# Patient Record
Sex: Female | Born: 1985 | Race: Asian | Hispanic: No | Marital: Married | Smoking: Never smoker
Health system: Southern US, Community
[De-identification: ages and names within clinical notes are randomized; demographics above are authoritative.]

## PROBLEM LIST (undated history)

## (undated) ENCOUNTER — Inpatient Hospital Stay (HOSPITAL_COMMUNITY): Payer: Self-pay

## (undated) DIAGNOSIS — B1910 Unspecified viral hepatitis B without hepatic coma: Secondary | ICD-10-CM

## (undated) HISTORY — PX: NO PAST SURGERIES: SHX2092

---

## 2012-07-22 ENCOUNTER — Inpatient Hospital Stay (HOSPITAL_COMMUNITY)
Admission: AD | Admit: 2012-07-22 | Discharge: 2012-07-22 | Disposition: A | Discharge status: Home / Self Care | Payer: BC Managed Care – PPO | Source: Ambulatory Visit | Attending: Family Medicine | Admitting: Family Medicine

## 2012-07-22 ENCOUNTER — Inpatient Hospital Stay (HOSPITAL_COMMUNITY): Payer: BC Managed Care – PPO

## 2012-07-22 ENCOUNTER — Encounter (HOSPITAL_COMMUNITY): Payer: Self-pay

## 2012-07-22 DIAGNOSIS — O200 Threatened abortion: Secondary | ICD-10-CM | POA: Insufficient documentation

## 2012-07-22 DIAGNOSIS — O00109 Unspecified tubal pregnancy without intrauterine pregnancy: Secondary | ICD-10-CM | POA: Insufficient documentation

## 2012-07-22 DIAGNOSIS — O209 Hemorrhage in early pregnancy, unspecified: Secondary | ICD-10-CM

## 2012-07-22 HISTORY — DX: Unspecified viral hepatitis B without hepatic coma: B19.10

## 2012-07-22 LAB — CBC WITH DIFFERENTIAL/PLATELET
Basophils Absolute: 0 10*3/uL (ref 0.0–0.1)
Eosinophils Relative: 3 % (ref 0–5)
HCT: 34.5 % — ABNORMAL LOW (ref 36.0–46.0)
Lymphocytes Relative: 16 % (ref 12–46)
Lymphs Abs: 1.6 10*3/uL (ref 0.7–4.0)
MCV: 71.6 fL — ABNORMAL LOW (ref 78.0–100.0)
Monocytes Absolute: 0.6 10*3/uL (ref 0.1–1.0)
Monocytes Relative: 6 % (ref 3–12)
RDW: 13.1 % (ref 11.5–15.5)
WBC: 10 10*3/uL (ref 4.0–10.5)

## 2012-07-22 LAB — URINALYSIS, ROUTINE W REFLEX MICROSCOPIC
Glucose, UA: NEGATIVE mg/dL
Protein, ur: 100 mg/dL — AB
pH: 5.5 (ref 5.0–8.0)

## 2012-07-22 LAB — POCT PREGNANCY, URINE: Preg Test, Ur: POSITIVE — AB

## 2012-07-22 LAB — URINE MICROSCOPIC-ADD ON

## 2012-07-22 MED ORDER — HYDROCODONE-ACETAMINOPHEN 5-325 MG PO TABS: 1.0000 | ORAL_TABLET | ORAL | Status: DC | PRN

## 2012-07-22 MED ORDER — IBUPROFEN 600 MG PO TABS: 600.0000 mg | ORAL_TABLET | Freq: Four times a day (QID) | ORAL | Status: DC | PRN

## 2012-07-22 NOTE — MAU Provider Note (Signed)
Chart reviewed and agree with management and plan.  

## 2012-07-22 NOTE — MAU Note (Signed)
Pt speaks minimal english per aunt. Will use language line for vietnamese interpreter to translate

## 2012-07-22 NOTE — MAU Provider Note (Signed)
History     CSN: [id number]  Arrival date and time: 07/22/12 0932   None     Chief Complaint  Patient presents with  . Vaginal Bleeding   Vaginal Bleeding  History provided by pt, with her friend acting as interpreter. Pt speaks [place]  Pt is a 27 y/o G2P1, LMP 06/14/12 (today is 07/22/12) who presents today with vaginal bleeding x 4 days. Her pregnancy was confirmed in a doctor's office although she doesn't know which one. She reports that since she started bleeding she has gone through 22 pads, 4 being today. Her bleeding has been similar to how she bleeding during a normal period, but today is the heaviest. She has passed either clots or tissue, she's not sure which. She has had intermittent pain across her suprapubic region that began around the same time as the bleeding. It feels like her menstrual cramps feel but today it is at its worst. She has not tried taking anything for the pain. She has felt well otherwise. She has a 2 y/o son who was born in [place] and was a normal pregnancy with no complications.    Past Medical History  Diagnosis Date  . Hepatitis B     Past Surgical History  Procedure Laterality Date  . No past surgeries      History reviewed. No pertinent family history.  History  Substance Use Topics  . Smoking status: Never Smoker   . Smokeless tobacco: Never Used  . Alcohol Use: No    Allergies: No Known Allergies  No prescriptions prior to admission    Review of Systems  Genitourinary: Positive for vaginal bleeding.   A 14-point review of systems was asked and positive pertinent and negatives are discussed in the HPI.  Physical Exam   Blood pressure 115/71, pulse 86, temperature 98.1 F (36.7 C), temperature source Oral, resp. rate 16, height 5\' 2"  (1.575 m), weight 72.576 kg (160 lb), last menstrual period 06/14/2012.  Physical Exam  Constitutional: She appears well-developed and well-nourished. No distress.  HENT:  Head:  Normocephalic.  Neck: Normal range of motion. Neck supple.  Respiratory: Effort normal. No respiratory distress.  Psychiatric: She has a normal mood and affect. Her behavior is normal.   Results for orders placed during the hospital encounter of 07/22/12 (from the past 24 hour(s))  URINALYSIS, ROUTINE W REFLEX MICROSCOPIC     Status: Abnormal   Collection Time    07/22/12  9:48 AM      Result Value Range   Color, Urine RED (*) YELLOW   APPearance CLOUDY (*) CLEAR   Specific Gravity, Urine 1.020  1.005 - 1.030   pH 5.5  5.0 - 8.0   Glucose, UA NEGATIVE  NEGATIVE mg/dL   Hgb urine dipstick LARGE (*) NEGATIVE   Bilirubin Urine NEGATIVE  NEGATIVE   Ketones, ur NEGATIVE  NEGATIVE mg/dL   Protein, ur 409 (*) NEGATIVE mg/dL   Urobilinogen, UA 1.0  0.0 - 1.0 mg/dL   Nitrite POSITIVE (*) NEGATIVE   Leukocytes, UA TRACE (*) NEGATIVE  URINE MICROSCOPIC-ADD ON     Status: Abnormal   Collection Time    07/22/12  9:48 AM      Result Value Range   Squamous Epithelial / LPF FEW (*) RARE   WBC, UA 0-2  <3 WBC/hpf   RBC / HPF TOO NUMEROUS TO COUNT  <3 RBC/hpf   Bacteria, UA FEW (*) RARE  POCT PREGNANCY, URINE     Status: Abnormal  Collection Time    07/22/12  9:50 AM      Result Value Range   Preg Test, Ur POSITIVE (*) NEGATIVE  HCG, QUANTITATIVE, PREGNANCY     Status: Abnormal   Collection Time    07/22/12 10:23 AM      Result Value Range   hCG, Beta Chain, Quant, S 5035 (*) <5 mIU/mL  CBC WITH DIFFERENTIAL     Status: Abnormal   Collection Time    07/22/12 10:25 AM      Result Value Range   WBC 10.0  4.0 - 10.5 K/uL   RBC 4.82  3.87 - 5.11 MIL/uL   Hemoglobin 12.0  12.0 - 15.0 g/dL   HCT 16.1 (*) 09.6 - 04.5 %   MCV 71.6 (*) 78.0 - 100.0 fL   MCH 24.9 (*) 26.0 - 34.0 pg   MCHC 34.8  30.0 - 36.0 g/dL   RDW 40.9  81.1 - 91.4 %   Platelets 195  150 - 400 K/uL   Neutrophils Relative 75  43 - 77 %   Neutro Abs 7.5  1.7 - 7.7 K/uL   Lymphocytes Relative 16  12 - 46 %   Lymphs Abs  1.6  0.7 - 4.0 K/uL   Monocytes Relative 6  3 - 12 %   Monocytes Absolute 0.6  0.1 - 1.0 K/uL   Eosinophils Relative 3  0 - 5 %   Eosinophils Absolute 0.3  0.0 - 0.7 K/uL   Basophils Relative 0  0 - 1 %   Basophils Absolute 0.0  0.0 - 0.1 K/uL    MAU Course  Procedures  Assessment and Plan  DDx: Ectopic pregnancy - can present with vaginal bleeding and abdominal pain but U/S did not see an ectopic. Spontaneous abortion - most likely given clinical presentation and ultrasound findings; can confirm with serum quantitave HCG in 48 hours. Vaginal/cervical lesion/injury - none visualized on physical exam  Abnormal bleeding during 1st trimester, likely spontaneous abortion - urine hcg positive - serum quant consistent with gestational age - Ultrasound - CBC - ABO/Rh - repeat quant HCG in 48 hours - Rx: Ibuprofen 600 mg Q6 hrs prn, no narcotics due to hydrocodone allergy - MDM: Hgb stable and not orthostatic. Ssmall amount of bleeding on return from U/S. Discharge home.   Lorna Dibble, PA-S 07/22/2012, 11:26 AM   MDM 27 y.o. female with bleeding in first trimester pregnancy. Ultrasound without adnexal mass and probably 4.4 weeks IUGS. Patient is not orthostatic and bleeding is light at this time. Detailed instructions on bleeding in early pregnancy given to the patient using a translator. Patient voices understanding. Discussed that this is most likely a failed pregnancy and patient will do expectant management. She will return in 2 days for follow up Bhcg. She will return immediately for any problems.

## 2012-07-22 NOTE — MAU Note (Signed)
Pt/aunt state had +upt at home 2 weeks ago, LMP-06/14/2012. Began bleeding/cramping on Saturday, cramping worse now. Blood noted in urine cup in MAU.

## 2012-07-25 ENCOUNTER — Inpatient Hospital Stay (HOSPITAL_COMMUNITY)
Admission: AD | Admit: 2012-07-25 | Discharge: 2012-07-25 | Disposition: A | Discharge status: Home / Self Care | Payer: BC Managed Care – PPO | Source: Ambulatory Visit | Attending: Obstetrics & Gynecology | Admitting: Obstetrics & Gynecology

## 2012-07-25 DIAGNOSIS — O039 Complete or unspecified spontaneous abortion without complication: Secondary | ICD-10-CM

## 2012-07-25 NOTE — MAU Provider Note (Signed)
  History     CSN: [id number]  Arrival date and time: 07/25/12 0756   None     No chief complaint on file.  HPI  Pt is here for f/u HCG after SAB.  Pt initially seen on 3/25 with vaginal bleeding for 4 days and ultrasound showed IUGS in lower uterine segment.  Her HCG was 5035 and pt is B positive.  Pt continues to have light bleeding and mild cramping.  Past Medical History  Diagnosis Date  . Hepatitis B     Past Surgical History  Procedure Laterality Date  . No past surgeries      No family history on file.  History  Substance Use Topics  . Smoking status: Never Smoker   . Smokeless tobacco: Never Used  . Alcohol Use: No    Allergies: No Known Allergies  Prescriptions prior to admission  Medication Sig Dispense Refill  . HYDROcodone-acetaminophen (NORCO/VICODIN) 5-325 MG per tablet Take 1 tablet by mouth every 4 (four) hours as needed for pain.  10 tablet  0  . ibuprofen (ADVIL,MOTRIN) 600 MG tablet Take 1 tablet (600 mg total) by mouth every 6 (six) hours as needed for pain.  30 tablet  0    ROS Physical Exam   Blood pressure 107/62, pulse 77, temperature 98.4 F (36.9 C), temperature source Oral, resp. rate 16, last menstrual period 06/14/2012.  Physical Exam  Nursing note and vitals reviewed. Constitutional: She is oriented to person, place, and time. She appears well-developed and well-nourished.  HENT:  Head: Normocephalic.  Eyes: Pupils are equal, round, and reactive to light.  Neck: Normal range of motion. Neck supple.  Cardiovascular: Normal rate.   Respiratory: Effort normal.  Musculoskeletal: Normal range of motion.  Neurological: She is alert and oriented to person, place, and time.  Skin: Skin is warm and dry.  Psychiatric: She has a normal mood and affect.    MAU Course  Procedures Results for orders placed during the hospital encounter of 07/25/12 (from the past 24 hour(s))  HCG, QUANTITATIVE, PREGNANCY     Status: Abnormal   Collection Time    07/25/12  8:05 AM      Result Value Range   hCG, Beta Chain, Quant, S 464 (*) <5 mIU/mL  f/u 1 week for repeat HCG  Assessment and Plan  SAB  Megan Benton 07/25/2012, 9:22 AM

## 2012-07-25 NOTE — MAU Note (Signed)
Pt presents for repeat blood work. Denies any pain. States very little vaginal bleeding.

## 2012-07-25 NOTE — MAU Provider Note (Signed)
Attestation of Attending Supervision of Advanced Practitioner (CNM/NP): Evaluation and management procedures were performed by the Advanced Practitioner under my supervision and collaboration.  I have reviewed the Advanced Practitioner's note and chart, and I agree with the management and plan.  Benton, Megan Carvell 1:48 PM     

## 2012-08-01 ENCOUNTER — Telehealth (HOSPITAL_COMMUNITY): Payer: Self-pay | Admitting: *Deleted

## 2012-08-01 ENCOUNTER — Inpatient Hospital Stay (HOSPITAL_COMMUNITY)
Admission: AD | Admit: 2012-08-01 | Discharge: 2012-08-01 | Disposition: A | Discharge status: Home / Self Care | Payer: BC Managed Care – PPO | Source: Ambulatory Visit | Attending: Obstetrics and Gynecology | Admitting: Obstetrics and Gynecology

## 2012-08-01 DIAGNOSIS — O039 Complete or unspecified spontaneous abortion without complication: Secondary | ICD-10-CM | POA: Insufficient documentation

## 2012-08-01 DIAGNOSIS — O034 Incomplete spontaneous abortion without complication: Secondary | ICD-10-CM

## 2012-08-01 LAB — HCG, QUANTITATIVE, PREGNANCY: hCG, Beta Chain, Quant, S: 17 m[IU]/mL — ABNORMAL HIGH (ref <5)

## 2012-08-01 NOTE — MAU Provider Note (Signed)
  History     CSN: [id number]  Arrival date and time: 08/01/12 2214   None     Chief Complaint  Patient presents with  . Follow-up   HPI  Megan Benton is a 27 y.o. G3P1001 who is here for repeat quant for SAB. She is no longer bleeding or having any pain.   Past Medical History  Diagnosis Date  . Hepatitis B     Past Surgical History  Procedure Laterality Date  . No past surgeries      No family history on file.  History  Substance Use Topics  . Smoking status: Never Smoker   . Smokeless tobacco: Never Used  . Alcohol Use: No    Allergies: No Known Allergies  Prescriptions prior to admission  Medication Sig Dispense Refill  . ibuprofen (ADVIL,MOTRIN) 600 MG tablet Take 1 tablet (600 mg total) by mouth every 6 (six) hours as needed for pain.  30 tablet  0    ROS Physical Exam   Blood pressure 93/61, pulse 92, temperature 97.8 F (36.6 C), resp. rate 18, height 5' 1.5" (1.562 m), weight 73.846 kg (162 lb 12.8 oz), last menstrual period 06/14/2012, SpO2 100.00%.  Physical Exam  MAU Course  Procedures  Results for orders placed during the hospital encounter of 08/01/12 (from the past 24 hour(s))  HCG, QUANTITATIVE, PREGNANCY     Status: Abnormal   Collection Time    08/01/12 10:23 PM      Result Value Range   hCG, Beta Chain, Quant, S 17 (*) <5 mIU/mL   Down from 400+  Assessment and Plan  SAB Repeat quant in 2+ weeks  Tawnya Crook 08/01/2012, 11:03 PM

## 2012-08-01 NOTE — MAU Note (Signed)
Megan Benton CNM reviewed lab results with pt and will f/u in 2 wks with BHCG. Pt agrees

## 2012-08-01 NOTE — MAU Note (Signed)
Here for repeat BHCG. Denies any pain, bleeding, or any problems.

## 2012-08-02 NOTE — MAU Provider Note (Signed)
Attestation of Attending Supervision of Advanced Practitioner (CNM/NP): Evaluation and management procedures were performed by the Advanced Practitioner under my supervision and collaboration.  I have reviewed the Advanced Practitioner's note and chart, and I agree with the management and plan.  Ora Mcnatt 08/02/2012 7:47 AM   

## 2013-05-27 ENCOUNTER — Encounter (HOSPITAL_COMMUNITY): Payer: Self-pay | Admitting: *Deleted

## 2013-08-01 ENCOUNTER — Inpatient Hospital Stay (HOSPITAL_COMMUNITY)
Admission: AD | Admit: 2013-08-01 | Discharge: 2013-08-02 | Disposition: A | Discharge status: Home / Self Care | Payer: BC Managed Care – PPO | Source: Ambulatory Visit | Attending: Family Medicine | Admitting: Family Medicine

## 2013-08-01 ENCOUNTER — Encounter (HOSPITAL_COMMUNITY): Payer: Self-pay | Admitting: *Deleted

## 2013-08-01 DIAGNOSIS — O208 Other hemorrhage in early pregnancy: Secondary | ICD-10-CM | POA: Insufficient documentation

## 2013-08-01 DIAGNOSIS — O200 Threatened abortion: Secondary | ICD-10-CM | POA: Insufficient documentation

## 2013-08-01 DIAGNOSIS — O43899 Other placental disorders, unspecified trimester: Secondary | ICD-10-CM

## 2013-08-01 DIAGNOSIS — O418X10 Other specified disorders of amniotic fluid and membranes, first trimester, not applicable or unspecified: Secondary | ICD-10-CM

## 2013-08-01 DIAGNOSIS — O468X1 Other antepartum hemorrhage, first trimester: Secondary | ICD-10-CM

## 2013-08-01 LAB — URINALYSIS, ROUTINE W REFLEX MICROSCOPIC
BILIRUBIN URINE: NEGATIVE
GLUCOSE, UA: NEGATIVE mg/dL
KETONES UR: NEGATIVE mg/dL
Leukocytes, UA: NEGATIVE
Nitrite: NEGATIVE
PROTEIN: NEGATIVE mg/dL
Specific Gravity, Urine: 1.015 (ref 1.005–1.030)
UROBILINOGEN UA: 2 mg/dL — AB (ref 0.0–1.0)
pH: 7.5 (ref 5.0–8.0)

## 2013-08-01 LAB — POCT PREGNANCY, URINE: Preg Test, Ur: POSITIVE — AB

## 2013-08-01 LAB — URINE MICROSCOPIC-ADD ON

## 2013-08-01 NOTE — MAU Note (Signed)
Had positive pregnancy test earlier this wk. Now having some bleeding. No pain. LMP 2/27

## 2013-08-02 ENCOUNTER — Encounter (HOSPITAL_COMMUNITY): Payer: Self-pay | Admitting: Obstetrics and Gynecology

## 2013-08-02 ENCOUNTER — Inpatient Hospital Stay (HOSPITAL_COMMUNITY): Payer: BC Managed Care – PPO

## 2013-08-02 LAB — CBC
HCT: 33.5 % — ABNORMAL LOW (ref 36.0–46.0)
HEMOGLOBIN: 11.8 g/dL — AB (ref 12.0–15.0)
MCH: 25.8 pg — ABNORMAL LOW (ref 26.0–34.0)
MCHC: 35.2 g/dL (ref 30.0–36.0)
MCV: 73.3 fL — ABNORMAL LOW (ref 78.0–100.0)
Platelets: 226 10*3/uL (ref 150–400)
RBC: 4.57 MIL/uL (ref 3.87–5.11)
RDW: 13.3 % (ref 11.5–15.5)
WBC: 10.1 10*3/uL (ref 4.0–10.5)

## 2013-08-02 LAB — WET PREP, GENITAL
Clue Cells Wet Prep HPF POC: NONE SEEN
Trich, Wet Prep: NONE SEEN
Yeast Wet Prep HPF POC: NONE SEEN

## 2013-08-02 LAB — ABO/RH: ABO/RH(D): B POS

## 2013-08-02 LAB — HCG, QUANTITATIVE, PREGNANCY: hCG, Beta Chain, Quant, S: 6087 m[IU]/mL — ABNORMAL HIGH (ref <5)

## 2013-08-02 NOTE — MAU Provider Note (Signed)
Attestation of Attending Supervision of Advanced Practitioner (PA/CNM/NP): Evaluation and management procedures were performed by the Advanced Practitioner under my supervision and collaboration.  I have reviewed the Advanced Practitioner's note and chart, and I agree with the management and plan.  PRATT,TANYA S, MD [facility] Attending 08/02/2013 7:11 AM   

## 2013-08-02 NOTE — MAU Provider Note (Signed)
History     CSN: [id number]  Arrival date and time: 08/01/13 2312   None     Chief Complaint  Patient presents with  . Vaginal Bleeding   HPI  [name] is a 28 y.o. female G3P1011 at [redacted]w[redacted]d who presents with vaginal bleeding. The bleeding started yesterday around 8 pm. The bleeding is described as dark red, small amount. No intercourse recently. Patient denies pain. The patient works in a [employer] and is concerned about working there while pregnant.  Pt is [facility] next week to schedule an appointment.   OB History   Grav Para Term Preterm Abortions TAB SAB Ect Mult Living   3 1 1  1  1   1       Past Medical History  Diagnosis Date  . Hepatitis B     Past Surgical History  Procedure Laterality Date  . No past surgeries      History reviewed. No pertinent family history.  History  Substance Use Topics  . Smoking status: Never Smoker   . Smokeless tobacco: Never Used  . Alcohol Use: No    Allergies: No Known Allergies  Prescriptions prior to admission  Medication Sig Dispense Refill  . ibuprofen (ADVIL,MOTRIN) 600 MG tablet Take 1 tablet (600 mg total) by mouth every 6 (six) hours as needed for pain.  30 tablet  0   Results for orders placed during the hospital encounter of 08/01/13 (from the past 48 hour(s))  URINALYSIS, ROUTINE W REFLEX MICROSCOPIC     Status: Abnormal   Collection Time    08/01/13 11:28 PM      Result Value Ref Range   Color, Urine YELLOW  YELLOW   APPearance CLEAR  CLEAR   Specific Gravity, Urine 1.015  1.005 - 1.030   pH 7.5  5.0 - 8.0   Glucose, UA NEGATIVE  NEGATIVE mg/dL   Hgb urine dipstick LARGE (*) NEGATIVE   Bilirubin Urine NEGATIVE  NEGATIVE   Ketones, ur NEGATIVE  NEGATIVE mg/dL   Protein, ur NEGATIVE  NEGATIVE mg/dL   Urobilinogen, UA 2.0 (*) 0.0 - 1.0 mg/dL   Nitrite NEGATIVE  NEGATIVE   Leukocytes, UA NEGATIVE  NEGATIVE  URINE MICROSCOPIC-ADD ON     Status: Abnormal   Collection  Time    08/01/13 11:28 PM      Result Value Ref Range   Squamous Epithelial / LPF FEW (*) RARE   WBC, UA 0-2  <3 WBC/hpf   RBC / HPF 0-2  <3 RBC/hpf   Bacteria, UA FEW (*) RARE  POCT PREGNANCY, URINE     Status: Abnormal   Collection Time    08/01/13 11:32 PM      Result Value Ref Range   Preg Test, Ur POSITIVE (*) NEGATIVE   Comment:            THE SENSITIVITY OF THIS     METHODOLOGY IS >24 mIU/mL  CBC     Status: Abnormal   Collection Time    08/02/13 12:20 AM      Result Value Ref Range   WBC 10.1  4.0 - 10.5 K/uL   RBC 4.57  3.87 - 5.11 MIL/uL   Hemoglobin 11.8 (*) 12.0 - 15.0 g/dL   HCT 40.9 (*) 81.1 - 91.4 %   MCV 73.3 (*) 78.0 - 100.0 fL   Comment: REPEATED TO VERIFY   MCH 25.8 (*) 26.0 - 34.0 pg   MCHC 35.2  30.0 - 36.0 g/dL   RDW 16.1  09.6 - 04.5 %   Platelets 226  150 - 400 K/uL  HCG, QUANTITATIVE, PREGNANCY     Status: Abnormal   Collection Time    08/02/13 12:20 AM      Result Value Ref Range   hCG, Beta Chain, Quant, S 6087 (*) <5 mIU/mL   Comment:              GEST. AGE      CONC.  (mIU/mL)       <=1 WEEK        5 - 50         2 WEEKS       50 - 500         3 WEEKS       100 - 10,000         4 WEEKS     1,000 - 30,000         5 WEEKS     3,500 - 115,000       6-8 WEEKS     12,000 - 270,000        12 WEEKS     15,000 - 220,000                FEMALE AND NON-PREGNANT FEMALE:         LESS THAN 5 mIU/mL  WET PREP, GENITAL     Status: Abnormal   Collection Time    08/02/13  2:05 AM      Result Value Ref Range   Yeast Wet Prep HPF POC NONE SEEN  NONE SEEN   Trich, Wet Prep NONE SEEN  NONE SEEN   Clue Cells Wet Prep HPF POC NONE SEEN  NONE SEEN   WBC, Wet Prep HPF POC FEW (*) NONE SEEN   Comment: FEW BACTERIA SEEN   US Ob Comp Less 14 Wks  08/02/2013   CLINICAL DATA:  Pain.  Pregnancy.  EXAM: OBSTETRIC <14 WK [place] AND TRANSVAGINAL OB US  TECHNIQUE: Both transabdominal and transvaginal ultrasound examinations were performed for complete evaluation of the  gestation as well as the maternal uterus, adnexal regions, and pelvic cul-de-sac. Transvaginal technique was performed to assess early pregnancy.  COMPARISON:  None.  FINDINGS: Intrauterine gestational sac: Visualized/normal in shape.  Yolk sac:  Present.  Embryo:  Present.  Cardiac Activity: Present.  Heart Rate: 104 bpm  CRL:   0.7 cm 6 w 4 d                  [place] EDC: 03/24/2014  Maternal uterus/adnexae: A 3.6 x 2.2 cm subchorionic hemorrhage is present. Follow-up pelvic ultrasound suggested.  IMPRESSION: 1. Single viable intrauterine pregnancy at 6 weeks 4 days. Fetal heart rate of 104 beats per min noted. 2. 3.6 x 2.2 cm subchorionic hemorrhage. Follow-up pelvic ultrasound suggested.   Electronically Signed   By: Maisie Fus  Register   On: 08/02/2013 02:01   US Ob Transvaginal  08/02/2013   CLINICAL DATA:  Pain.  Pregnancy.  EXAM: OBSTETRIC <14 WK [place] AND TRANSVAGINAL OB US  TECHNIQUE: Both transabdominal and transvaginal ultrasound examinations were performed for complete evaluation of the gestation as well as the maternal uterus, adnexal regions, and pelvic cul-de-sac. Transvaginal technique was performed to assess early pregnancy.  COMPARISON:  None.  FINDINGS: Intrauterine gestational sac: Visualized/normal in shape.  Yolk sac:  Present.  Embryo:  Present.  Cardiac Activity: Present.  Heart Rate: 104 bpm  CRL:   0.7 cm 6 w 4 d                  [place] EDC: 03/24/2014  Maternal uterus/adnexae: A 3.6 x 2.2 cm subchorionic hemorrhage is present. Follow-up pelvic ultrasound suggested.  IMPRESSION: 1. Single viable intrauterine pregnancy at 6 weeks 4 days. Fetal heart rate of 104 beats per min noted. 2. 3.6 x 2.2 cm subchorionic hemorrhage. Follow-up pelvic ultrasound suggested.   Electronically Signed   By: Maisie Fushomas  Register   On: 08/02/2013 02:01     Review of Systems  Constitutional: Negative for fever and chills.  Gastrointestinal: Negative for nausea, vomiting, abdominal pain, diarrhea and constipation.   Genitourinary: Negative for dysuria, urgency, frequency and hematuria.       No vaginal discharge. + vaginal bleeding; scant amount  No dysuria.    Physical Exam   Blood pressure 112/66, pulse 66, temperature 99 F (37.2 C), resp. rate 18, height 5\' 1"  (1.549 m), weight 72.757 kg (160 lb 6.4 oz), last menstrual period 06/26/2013, unknown if currently breastfeeding.  Physical Exam  Constitutional: She is oriented to person, place, and time. She appears well-developed and well-nourished. No distress.  HENT:  Head: Normocephalic.  Eyes: Pupils are equal, round, and reactive to light.  Neck: Neck supple.  GI: Soft. She exhibits no distension. There is no tenderness. There is no rebound and no guarding.  Genitourinary:  Speculum exam: Vagina - Small amount of dark red blood in vaginal canal, no odor Cervix - small active bleeding  Bimanual exam; Cervix FT  Uterus non tender, normal size; enlarged  Adnexa non tender, no masses bilaterally GC/Chlam, wet prep done Chaperone present for exam.   Musculoskeletal: Normal range of motion.  Neurological: She is alert and oriented to person, place, and time.  Skin: Skin is warm. She is not diaphoretic.  Psychiatric: Her behavior is normal.    MAU Course  Procedures  MDM CBC ABO Beta hcg Moderate subchorionic hemorrhage  Preliminary US shows IUP 1732w4d with moderate subchorionic hemorrhage. + cardiac activity with a rate of 104.  B positive blood type  Assessment and Plan   A:  Vaginal bleeding in pregnancy; first trimester  Subchorionic hemorrhage  Threatened miscarriage   P:  Discharge home in stable condition Bleeding precautions discussed [facility] provided; start prenatal care.  Pelvic rest Return to MAU as needed, if symptoms worsen    Iona HansenJennifer Irene Davin Archuletta, NP  08/02/2013, 2:27 AM

## 2013-08-03 LAB — GC/CHLAMYDIA PROBE AMP
CT Probe RNA: NEGATIVE
GC Probe RNA: NEGATIVE

## 2014-03-01 ENCOUNTER — Encounter (HOSPITAL_COMMUNITY): Payer: Self-pay | Admitting: Obstetrics and Gynecology

## 2014-06-06 ENCOUNTER — Encounter (HOSPITAL_COMMUNITY): Payer: Self-pay | Admitting: *Deleted

## 2015-05-01 NOTE — L&D Delivery Note (Signed)
Pt complete and at +1 station with urge to push. Epidural controlling pain. Pt pushed for about 20-30 mins to deliver a viable female infant in ROA position over intact perineum.  Anterior and posterior shoulders spontaneously delivered with next two pushes; body easily followed next. Infant placed on mothers abdomen and bulb suction of mouth and nose performed. Cord was then clamped and cut by FOB. Cord blood obtained. Baby had a vigorous spontaneous cry noted. Placenta remained intrauterine at post delivery. Counselled pt via interpreter about needed to manually remove placenta. Attempted x 1 with no success. Dr Ellyn Hack present and attempted x 2 with successful manual extraction of placenta;  3VC shultz. Fundal massage performed and pitocin per protocol. Fundus firm. First degree lac now noted as well as periurethral lac. Repaired with 4-0 vicryl on an SH and 2-0 vicryl. Pt tolerated well. Mother and baby stable. Counts correct. Apgars 9 and 9

## 2015-05-19 LAB — OB RESULTS CONSOLE RUBELLA ANTIBODY, IGM: Rubella: IMMUNE

## 2015-05-19 LAB — OB RESULTS CONSOLE HIV ANTIBODY (ROUTINE TESTING): HIV: NONREACTIVE

## 2015-05-19 LAB — OB RESULTS CONSOLE GC/CHLAMYDIA
CHLAMYDIA, DNA PROBE: NEGATIVE
GC PROBE AMP, GENITAL: NEGATIVE

## 2015-05-19 LAB — OB RESULTS CONSOLE HEPATITIS B SURFACE ANTIGEN: Hepatitis B Surface Ag: NEGATIVE

## 2015-05-19 LAB — OB RESULTS CONSOLE RPR: RPR: NONREACTIVE

## 2015-05-20 LAB — OB RESULTS CONSOLE HEPATITIS B SURFACE ANTIGEN: HEP B S AG: POSITIVE

## 2015-07-12 ENCOUNTER — Ambulatory Visit (INDEPENDENT_AMBULATORY_CARE_PROVIDER_SITE_OTHER): Payer: BLUE CROSS/BLUE SHIELD | Admitting: Internal Medicine

## 2015-07-12 VITALS — Wt 140.0 lb

## 2015-07-12 DIAGNOSIS — B1910 Unspecified viral hepatitis B without hepatic coma: Secondary | ICD-10-CM

## 2015-07-12 DIAGNOSIS — B169 Acute hepatitis B without delta-agent and without hepatic coma: Secondary | ICD-10-CM | POA: Diagnosis not present

## 2015-07-12 NOTE — Progress Notes (Signed)
RFV: chronic hep b in the setting of pregnancy Subjective:    Patient ID: Megan Benton, female    DOB: 08-Jul-1985, 30 y.o.   MRN: [id number]  HPI STATEien is a 29yo F, vietnamese speaking, G4P1, in her 5month of pregnancy. Thus far doing ok with her pregnancy, denies morning sickness, reflux or fatigue. She has past med hx of hep B, previously treated in [place] for 1 year due to elevated LFTs though she is unsure which medication in 2010. She reports that at her 2nd pregnancy she was also taking tx. Her 2nd nad 3rd pregnancy are SAB. Her first born, her 595 yo daughter is in good health, UTD on childhood vaccinations.  She denies any episodes of jaundice. No hospitalizations for liver disorder, she did have chicken pox at high school age. No surgeries.  Past med hx: chronic hep b   Family hx: No family hx of hep b or liver cancer  Soc hx: Married. Healthy 30 year old girl. No IVDU, no ETOh, no smoking, no excess tylenol use  No Known Allergies No current outpatient prescriptions on file prior to visit.   No current facility-administered medications on file prior to visit.    Review of Systems  Constitutional: Negative for fever, chills, diaphoresis, activity change, appetite change, fatigue and unexpected weight change.  HENT: Negative for congestion, sore throat, rhinorrhea, sneezing, trouble swallowing and sinus pressure.  Eyes: Negative for photophobia and visual disturbance.  Respiratory: Negative for cough, chest tightness, shortness of breath, wheezing and stridor.  Cardiovascular: Negative for chest pain, palpitations and leg swelling.  Gastrointestinal: Negative for nausea, vomiting, abdominal pain, diarrhea, constipation, blood in stool, abdominal distention and anal bleeding.  Genitourinary: Negative for dysuria, hematuria, flank pain and difficulty urinating.  Musculoskeletal: Negative for myalgias, back pain, joint swelling, arthralgias and gait problem.  Skin: Negative for  color change, pallor, rash and wound.  Neurological: Negative for dizziness, tremors, weakness and light-headedness.  Hematological: Negative for adenopathy. Does not bruise/bleed easily.  Psychiatric/Behavioral: Negative for behavioral problems, confusion, sleep disturbance, dysphoric mood, decreased concentration and agitation.       Objective:   Physical Exam Wt 140 lb (63.504 kg) Physical Exam  Constitutional:  oriented to person, place, and time. appears well-developed and well-nourished. No distress.  HENT: [place]/AT, PERRLA, no scleral icterus Mouth/Throat: Oropharynx is clear and moist. No oropharyngeal exudate.  Cardiovascular: Normal rate, regular rhythm and normal heart sounds. Exam reveals no gallop and no friction rub.  No murmur heard.  Pulmonary/Chest: Effort normal and breath sounds normal. No respiratory distress.  has no wheezes.  Neck = supple, no nuchal rigidity Abdominal: Soft. Bowel sounds are normal.  exhibits no distension. There is no tenderness.  Lymphadenopathy: no cervical adenopathy. No axillary adenopathy Neurological: alert and oriented to person, place, and time.  Skin: Skin is warm and dry. No rash noted. No erythema.  Psychiatric: a normal mood and affect.  behavior is normal.   Reviewed labs and work up from ob office:  Hep b s ag is positive Hep b s ab is negative  Hep b e ag positive  ast 14 Alt 10 Hep b viral load 205,000      Assessment & Plan:  Will check hep a ab to see if she needs imms ag hep a. Will check hep b e ab, and hep b viral load to see if needs to start treatment. We will repeat labs also at 2/3rd trimester  Will also coordinate testing for husband, according  to epic he has not been tested for hep B. Their daughter has received her childhood vaccinations  In mothers who have chronic hep B, if her viral load is not high enough to need treatment, at a minimum, we would recommend that the newborn receive hep B immunoglobulin plus  hep B vaccination series  Pregnancy = recommend she takes prenatal vitamins, and also would be candidate for tdap vaccine if not already received.  Spent 60 min with patient with greater than 50% in counseling, had vietnamese interpreter to help

## 2015-07-13 LAB — HEPATITIS A ANTIBODY, TOTAL: Hep A Total Ab: REACTIVE — AB

## 2015-07-14 LAB — HEPATITIS B E ANTIBODY: Hepatitis Be Antibody: NONREACTIVE

## 2015-07-19 LAB — HEPATITIS B DNA, ULTRAQUANTITATIVE, PCR
HEPATITIS B DNA (CALC): 4.75 {Log_IU}/mL — AB (ref <1.30)
HEPATITIS B DNA: 56378 [IU]/mL — AB (ref <20)

## 2015-08-31 ENCOUNTER — Ambulatory Visit (INDEPENDENT_AMBULATORY_CARE_PROVIDER_SITE_OTHER): Payer: BLUE CROSS/BLUE SHIELD | Admitting: Internal Medicine

## 2015-08-31 ENCOUNTER — Encounter: Payer: Self-pay | Admitting: Internal Medicine

## 2015-08-31 VITALS — BP 99/59 | HR 66 | Temp 98.0°F | Ht 62.99 in | Wt 144.5 lb

## 2015-08-31 DIAGNOSIS — B169 Acute hepatitis B without delta-agent and without hepatic coma: Secondary | ICD-10-CM | POA: Diagnosis not present

## 2015-08-31 DIAGNOSIS — B1910 Unspecified viral hepatitis B without hepatic coma: Secondary | ICD-10-CM | POA: Insufficient documentation

## 2015-08-31 LAB — COMPLETE METABOLIC PANEL WITH GFR
ALT: 9 U/L (ref 6–29)
AST: 11 U/L (ref 10–30)
Albumin: 3.4 g/dL — ABNORMAL LOW (ref 3.6–5.1)
Alkaline Phosphatase: 54 U/L (ref 33–115)
BUN: 11 mg/dL (ref 7–25)
CHLORIDE: 103 mmol/L (ref 98–110)
CO2: 24 mmol/L (ref 20–31)
Calcium: 8.5 mg/dL — ABNORMAL LOW (ref 8.6–10.2)
Creat: 0.63 mg/dL (ref 0.50–1.10)
GFR, Est African American: >89 mL/min (ref >=60)
GFR, Est Non African American: >89 mL/min (ref >=60)
GLUCOSE: 80 mg/dL (ref 65–99)
Potassium: 4.1 mmol/L (ref 3.5–5.3)
Sodium: 135 mmol/L (ref 135–146)
Total Bilirubin: 0.4 mg/dL (ref 0.2–1.2)
Total Protein: 6.8 g/dL (ref 6.1–8.1)

## 2015-08-31 NOTE — Progress Notes (Signed)
RFV: follow up for chronic hepatitis b during pregnancy  Subjective:    Patient ID: [name], female    DOB: Mar 24, 1986, 30 y.o.   MRN: [id number]  HPI  29yo F with chronic hepatitis B who is in her 2nd trimester of pregnancy. She is due August 27th. She reports overall having good health. No difficulty with pregnancy thus far. She reports some itchiness to her gravid abdomen. We first saw her in clinic mid march hep B s Ag +, VL 56,000, however at the beginning of her pregnancy there is mention to VL 200,000. She has taken antivirals in the past for her prior pregnancy per her report in [place]. No difficulties with her health since we last saw her. Continues to take prenatal vitamins.  At last visit, we also tested her husband who is immune. Her daughter is hep b immune No Known Allergies No current outpatient prescriptions on file prior to visit.   No current facility-administered medications on file prior to visit.   Active Ambulatory Problems    Diagnosis Date Noted  . Hepatitis B virus infection 08/31/2015   Resolved Ambulatory Problems    Diagnosis Date Noted  . No Resolved Ambulatory Problems   Past Medical History  Diagnosis Date  . Hepatitis B    Social History  Substance Use Topics  . Smoking status: Never Smoker   . Smokeless tobacco: Never Used  . Alcohol Use: No   Family hx: chronic hep b  Review of Systems  Constitutional: Negative for fever, chills, diaphoresis, activity change, appetite change, fatigue and unexpected weight change.  HENT: Negative for congestion, sore throat, rhinorrhea, sneezing, trouble swallowing and sinus pressure.  Eyes: Negative for photophobia and visual disturbance.  Respiratory: Negative for cough, chest tightness, shortness of breath, wheezing and stridor.  Cardiovascular: Negative for chest pain, palpitations and leg swelling.  Gastrointestinal: Negative for nausea, vomiting, abdominal pain, diarrhea, constipation, blood in  stool, abdominal distention and anal bleeding.  Genitourinary: Negative for dysuria, hematuria, flank pain and difficulty urinating.  Musculoskeletal: Negative for myalgias, back pain, joint swelling, arthralgias and gait problem.  Skin: Negative for color change, pallor, rash and wound.  Neurological: Negative for dizziness, tremors, weakness and light-headedness.  Hematological: Negative for adenopathy. Does not bruise/bleed easily.  Psychiatric/Behavioral: Negative for behavioral problems, confusion, sleep disturbance, dysphoric mood, decreased concentration and agitation.       Objective:   Physical Exam  BP 99/59 mmHg  Pulse 66  Temp(Src) 98 F (36.7 C) (Oral)  Ht 5' 2.99" (1.6 m)  Wt 144 lb 8 oz (65.545 kg)  BMI 25.60 kg/m2  LMP  Physical Exam  Constitutional:  oriented to person, place, and time. appears well-developed and well-nourished. No distress.  HENT: [place]/AT, PERRLA, no scleral icterus Mouth/Throat: Oropharynx is clear and moist. No oropharyngeal exudate.  Cardiovascular: Normal rate, regular rhythm and normal heart sounds. Exam reveals no gallop and no friction rub.  No murmur heard.  Abdominal: + gravid Neurological: alert and oriented to person, place, and time.  Skin: Skin is warm and dry. No rash noted. No erythema.  Psychiatric: a normal mood and affect.  behavior is normal.        Assessment & Plan:  Chronic hep b = will check hep b viral load, and cmp to decide if need to initiate anti=virals. Once viral load returns next week, will give her a call for the plan of care  rtc in early july  Recommend for infant to receive hep B  immunoglobulins and start hep B vaccination series.

## 2015-09-02 LAB — HEPATITIS B DNA, ULTRAQUANTITATIVE, PCR
HEPATITIS B DNA (CALC): 4.73 {Log_IU}/mL — AB (ref <1.30)
HEPATITIS B DNA: 53532 [IU]/mL — AB (ref <20)

## 2015-09-14 ENCOUNTER — Telehealth: Payer: Self-pay | Admitting: Pharmacist Clinician (PhC)/ Clinical Pharmacy Specialist

## 2015-09-14 NOTE — Telephone Encounter (Signed)
[facility] back [facility] due to her VL not >200k. Explained that they will give the infant at birth 2 different meds to protect the baby. He understood.

## 2015-09-29 ENCOUNTER — Ambulatory Visit: Payer: BLUE CROSS/BLUE SHIELD | Admitting: Internal Medicine

## 2015-10-04 ENCOUNTER — Ambulatory Visit: Payer: BLUE CROSS/BLUE SHIELD | Admitting: Internal Medicine

## 2015-10-31 ENCOUNTER — Ambulatory Visit: Payer: BLUE CROSS/BLUE SHIELD | Admitting: Internal Medicine

## 2015-11-09 LAB — OB RESULTS CONSOLE GBS: STREP GROUP B AG: NEGATIVE

## 2015-11-29 ENCOUNTER — Inpatient Hospital Stay (HOSPITAL_COMMUNITY)
Admission: AD | Admit: 2015-11-29 | Discharge: 2015-12-01 | DRG: 767 | Disposition: A | Discharge status: Home / Self Care | Payer: BLUE CROSS/BLUE SHIELD | Source: Ambulatory Visit | Attending: Obstetrics and Gynecology | Admitting: Obstetrics and Gynecology

## 2015-11-29 ENCOUNTER — Inpatient Hospital Stay (HOSPITAL_COMMUNITY): Payer: BLUE CROSS/BLUE SHIELD | Admitting: Anesthesiology

## 2015-11-29 ENCOUNTER — Encounter (HOSPITAL_COMMUNITY): Payer: Self-pay

## 2015-11-29 DIAGNOSIS — B181 Chronic viral hepatitis B without delta-agent: Secondary | ICD-10-CM | POA: Diagnosis present

## 2015-11-29 DIAGNOSIS — O9842 Viral hepatitis complicating childbirth: Secondary | ICD-10-CM | POA: Diagnosis present

## 2015-11-29 DIAGNOSIS — Z3A39 39 weeks gestation of pregnancy: Secondary | ICD-10-CM

## 2015-11-29 DIAGNOSIS — Z3483 Encounter for supervision of other normal pregnancy, third trimester: Secondary | ICD-10-CM | POA: Diagnosis present

## 2015-11-29 LAB — TYPE AND SCREEN
ABO/RH(D): B POS
Antibody Screen: NEGATIVE

## 2015-11-29 LAB — CBC
HEMATOCRIT: 31.1 % — AB (ref 36.0–46.0)
HEMOGLOBIN: 10.7 g/dL — AB (ref 12.0–15.0)
MCH: 24.9 pg — AB (ref 26.0–34.0)
MCHC: 34.4 g/dL (ref 30.0–36.0)
MCV: 72.3 fL — AB (ref 78.0–100.0)
Platelets: 219 10*3/uL (ref 150–400)
RBC: 4.3 MIL/uL (ref 3.87–5.11)
RDW: 13.7 % (ref 11.5–15.5)
WBC: 10.5 10*3/uL (ref 4.0–10.5)

## 2015-11-29 LAB — RPR: RPR: NONREACTIVE

## 2015-11-29 MED ORDER — LACTATED RINGERS IV SOLN: 500.0000 mL | INTRAVENOUS | Status: DC | PRN

## 2015-11-29 MED ORDER — ONDANSETRON HCL 4 MG PO TABS: 4.0000 mg | ORAL_TABLET | ORAL | Status: DC | PRN

## 2015-11-29 MED ORDER — EPHEDRINE 5 MG/ML INJ
10.0000 mg | INTRAVENOUS | Status: DC | PRN
  Filled 2015-11-29: qty 4

## 2015-11-29 MED ORDER — LIDOCAINE HCL (PF) 1 % IJ SOLN
INTRAMUSCULAR | Status: DC | PRN
  Administered 2015-11-29 (×2): 6 mL

## 2015-11-29 MED ORDER — FENTANYL 2.5 MCG/ML BUPIVACAINE 1/10 % EPIDURAL INFUSION (WH - ANES)
INTRAMUSCULAR | Status: DC | PRN
  Administered 2015-11-29: 14 mL/h via EPIDURAL

## 2015-11-29 MED ORDER — BENZOCAINE-MENTHOL 20-0.5 % EX AERO
1.0000 "application " | INHALATION_SPRAY | CUTANEOUS | Status: DC | PRN
  Administered 2015-11-29: 1 via TOPICAL
  Filled 2015-11-29: qty 56

## 2015-11-29 MED ORDER — IBUPROFEN 600 MG PO TABS
600.0000 mg | ORAL_TABLET | Freq: Four times a day (QID) | ORAL | Status: DC
  Administered 2015-11-29 – 2015-12-01 (×9): 600 mg via ORAL
  Filled 2015-11-29 (×9): qty 1

## 2015-11-29 MED ORDER — FENTANYL CITRATE (PF) 100 MCG/2ML IJ SOLN
100.0000 ug | Freq: Once | INTRAMUSCULAR | Status: AC
  Administered 2015-11-29: 100 ug via INTRAVENOUS

## 2015-11-29 MED ORDER — ACETAMINOPHEN 325 MG PO TABS: 650.0000 mg | ORAL_TABLET | ORAL | Status: DC | PRN

## 2015-11-29 MED ORDER — SOD CITRATE-CITRIC ACID 500-334 MG/5ML PO SOLN: 30.0000 mL | ORAL | Status: DC | PRN

## 2015-11-29 MED ORDER — TETANUS-DIPHTH-ACELL PERTUSSIS 5-2.5-18.5 LF-MCG/0.5 IM SUSP: 0.5000 mL | Freq: Once | INTRAMUSCULAR | Status: DC

## 2015-11-29 MED ORDER — LIDOCAINE HCL (PF) 1 % IJ SOLN
30.0000 mL | INTRAMUSCULAR | Status: DC | PRN
  Filled 2015-11-29: qty 30

## 2015-11-29 MED ORDER — COCONUT OIL OIL: 1.0000 "application " | TOPICAL_OIL | Status: DC | PRN

## 2015-11-29 MED ORDER — PHENYLEPHRINE 40 MCG/ML (10ML) SYRINGE FOR IV PUSH (FOR BLOOD PRESSURE SUPPORT)
80.0000 ug | PREFILLED_SYRINGE | INTRAVENOUS | Status: DC | PRN
  Filled 2015-11-29: qty 5

## 2015-11-29 MED ORDER — FENTANYL CITRATE (PF) 100 MCG/2ML IJ SOLN
INTRAMUSCULAR | Status: AC
  Filled 2015-11-29: qty 2

## 2015-11-29 MED ORDER — LACTATED RINGERS IV SOLN: 500.0000 mL | Freq: Once | INTRAVENOUS | Status: DC

## 2015-11-29 MED ORDER — LACTATED RINGERS IV SOLN: INTRAVENOUS | Status: DC

## 2015-11-29 MED ORDER — OXYCODONE-ACETAMINOPHEN 5-325 MG PO TABS: 2.0000 | ORAL_TABLET | ORAL | Status: DC | PRN

## 2015-11-29 MED ORDER — SIMETHICONE 80 MG PO CHEW: 80.0000 mg | CHEWABLE_TABLET | ORAL | Status: DC | PRN

## 2015-11-29 MED ORDER — WITCH HAZEL-GLYCERIN EX PADS: 1.0000 "application " | MEDICATED_PAD | CUTANEOUS | Status: DC | PRN

## 2015-11-29 MED ORDER — ZOLPIDEM TARTRATE 5 MG PO TABS: 5.0000 mg | ORAL_TABLET | Freq: Every evening | ORAL | Status: DC | PRN

## 2015-11-29 MED ORDER — OXYTOCIN 40 UNITS IN LACTATED RINGERS INFUSION - SIMPLE MED
2.5000 [IU]/h | INTRAVENOUS | Status: DC
  Filled 2015-11-29: qty 1000

## 2015-11-29 MED ORDER — DIPHENHYDRAMINE HCL 50 MG/ML IJ SOLN: 12.5000 mg | INTRAMUSCULAR | Status: DC | PRN

## 2015-11-29 MED ORDER — DIBUCAINE 1 % RE OINT: 1.0000 "application " | TOPICAL_OINTMENT | RECTAL | Status: DC | PRN

## 2015-11-29 MED ORDER — ACETAMINOPHEN 325 MG PO TABS
650.0000 mg | ORAL_TABLET | ORAL | Status: DC | PRN
  Administered 2015-11-29 – 2015-11-30 (×3): 650 mg via ORAL
  Filled 2015-11-29 (×3): qty 2

## 2015-11-29 MED ORDER — FENTANYL 2.5 MCG/ML BUPIVACAINE 1/10 % EPIDURAL INFUSION (WH - ANES)
INTRAMUSCULAR | Status: AC
  Filled 2015-11-29: qty 125

## 2015-11-29 MED ORDER — PRENATAL MULTIVITAMIN CH
1.0000 | ORAL_TABLET | Freq: Every day | ORAL | Status: DC
  Administered 2015-11-30 – 2015-12-01 (×2): 1 via ORAL
  Filled 2015-11-29 (×2): qty 1

## 2015-11-29 MED ORDER — CEFAZOLIN SODIUM-DEXTROSE 2-4 GM/100ML-% IV SOLN
2.0000 g | Freq: Three times a day (TID) | INTRAVENOUS | Status: DC
  Administered 2015-11-29 – 2015-11-30 (×3): 2 g via INTRAVENOUS
  Filled 2015-11-29 (×4): qty 100

## 2015-11-29 MED ORDER — OXYCODONE-ACETAMINOPHEN 5-325 MG PO TABS: 1.0000 | ORAL_TABLET | ORAL | Status: DC | PRN

## 2015-11-29 MED ORDER — ONDANSETRON HCL 4 MG/2ML IJ SOLN: 4.0000 mg | INTRAMUSCULAR | Status: DC | PRN

## 2015-11-29 MED ORDER — OXYTOCIN BOLUS FROM INFUSION
500.0000 mL | Freq: Once | INTRAVENOUS | Status: AC
  Administered 2015-11-29: 500 mL via INTRAVENOUS

## 2015-11-29 MED ORDER — ONDANSETRON HCL 4 MG/2ML IJ SOLN: 4.0000 mg | Freq: Four times a day (QID) | INTRAMUSCULAR | Status: DC | PRN

## 2015-11-29 MED ORDER — DIPHENHYDRAMINE HCL 25 MG PO CAPS: 25.0000 mg | ORAL_CAPSULE | Freq: Four times a day (QID) | ORAL | Status: DC | PRN

## 2015-11-29 MED ORDER — FLEET ENEMA 7-19 GM/118ML RE ENEM: 1.0000 | ENEMA | RECTAL | Status: DC | PRN

## 2015-11-29 MED ORDER — SENNOSIDES-DOCUSATE SODIUM 8.6-50 MG PO TABS
2.0000 | ORAL_TABLET | ORAL | Status: DC
  Administered 2015-11-30 – 2015-12-01 (×2): 2 via ORAL
  Filled 2015-11-29 (×2): qty 2

## 2015-11-29 MED ORDER — PHENYLEPHRINE 40 MCG/ML (10ML) SYRINGE FOR IV PUSH (FOR BLOOD PRESSURE SUPPORT)
PREFILLED_SYRINGE | INTRAVENOUS | Status: DC
Start: 2015-11-29 — End: 2015-11-29
  Filled 2015-11-29: qty 20

## 2015-11-29 NOTE — H&P (Signed)
Megan Benton is a 30 y.o. female presenting for active labor. Pt reports pain since last night - was noted to be 7cm dilated in MAU. Shortly changed to 8cm on L/D. Pt is currently receiving epidural per request.  EDC is based on 10week [place]. Pt is hepatits B positive. Has been followed by ID. Prental care otherwise benign. GBS negative. Passed glucose screen  OB History    Gravida Para Term Preterm AB Living   4 1 1   1 1    SAB TAB Ectopic Multiple Live Births   1             Past Medical History:  Diagnosis Date  . Hepatitis B    Past Surgical History:  Procedure Laterality Date  . NO PAST SURGERIES     Family History: family history is not on file. Social History:  reports that she has never smoked. She has never used smokeless tobacco. She reports that she does not drink alcohol or use drugs.     Maternal Diabetes: No Genetic Screening: Declined Maternal Ultrasounds/Referrals: Normal Fetal Ultrasounds or other Referrals:  None Maternal Substance Abuse:  No Significant Maternal Medications:  None Significant Maternal Lab Results:  Lab values include: HBsAG positive Other Comments:  None  Review of Systems  Constitutional: Positive for malaise/fatigue. Negative for chills, fever and weight loss.  Eyes: Negative for blurred vision.  Respiratory: Negative for shortness of breath.   Cardiovascular: Negative for chest pain.  Gastrointestinal: Positive for abdominal pain. Negative for heartburn, nausea and vomiting.  Genitourinary: Negative for dysuria.  Musculoskeletal: Positive for back pain.  Skin: Negative for itching and rash.  Neurological: Negative for dizziness and headaches.  Psychiatric/Behavioral: Negative for depression, hallucinations, substance abuse and suicidal ideas. The patient is not nervous/anxious.    Maternal Medical History:  Reason for admission: Contractions.  Nausea.  Contractions: Onset was yesterday.   Frequency: regular.   Perceived severity is  strong.    Fetal activity: Perceived fetal activity is normal.   Last perceived fetal movement was within the past hour.    Prenatal complications: Infection (viral hepatitis- seen by ID).   Prenatal Complications - Diabetes: none.    Dilation: 8 Effacement (%): 90 Station: +1 Exam by:: Eula Listen unknown if currently breastfeeding. Maternal Exam:  Uterine Assessment: Contraction strength is moderate.  Contraction frequency is regular.   Abdomen: Patient reports generalized tenderness.  Estimated fetal weight is AGA.    Introitus: Normal vulva. Normal vagina.  Pelvis: adequate for delivery.   Cervix: Cervix evaluated by digital exam.     Physical Exam  Constitutional: She is oriented to person, place, and time. She appears well-developed and well-nourished.  HENT:  Head: Normocephalic.  Neck: Normal range of motion.  Respiratory: Effort normal.  GI: Soft. There is generalized tenderness.  Genitourinary: Vagina normal and uterus normal.  Musculoskeletal: Normal range of motion.  Neurological: She is alert and oriented to person, place, and time.  Skin: Skin is warm.  Psychiatric: She has a normal mood and affect. Her behavior is normal. Judgment and thought content normal.    Prenatal labs: ABO, Rh: --/--/B POS (08/01 0410) Antibody: NEG (08/01 0410) Rubella:   RPR:    HBsAg:    HIV:    GBS:     Assessment/Plan: G4P1021 at 391/7wks in active labor Epidural for pain control AROM of forebag done - clear fluid; moderate amount GBS negative Expectant management- anticipate svd   [facility] Denario Bagot 11/29/2015, 5:12 AM

## 2015-11-29 NOTE — Progress Notes (Signed)
Patient ID: Megan Benton, female   DOB: 07-08-1985, 30 y.o.   MRN: [id number] Pt comfortable with epidural. No complaints VSS EFM- 150s, +accels, -decels, moderate variability, cat 1 ToCo- contractions q 1-30mins SVE complete  A/P: Will start pushing          Anticipate svd

## 2015-11-29 NOTE — Anesthesia Preprocedure Evaluation (Signed)
Anesthesia Evaluation  Patient identified by MRN, date of birth, ID band Patient awake and Patient unresponsive    Reviewed: Allergy & Precautions, NPO status , Patient's Chart, lab work & pertinent test results  Airway Mallampati: II  TM Distance: >3 FB Neck ROM: Full    Dental no notable dental hx.    Pulmonary neg pulmonary ROS,    Pulmonary exam normal breath sounds clear to auscultation       Cardiovascular negative cardio ROS Normal cardiovascular exam Rhythm:Regular Rate:Normal     Neuro/Psych negative neurological ROS  negative psych ROS   GI/Hepatic negative GI ROS, (+) Hepatitis -, B  Endo/Other  negative endocrine ROS  Renal/GU negative Renal ROS  negative genitourinary   Musculoskeletal negative musculoskeletal ROS (+)   Abdominal   Peds negative pediatric ROS (+)  Hematology negative hematology ROS (+)   Anesthesia Other Findings   Reproductive/Obstetrics negative OB ROS                             Anesthesia Physical Anesthesia Plan  ASA: II  Anesthesia Plan: Epidural   Post-op Pain Management:    Induction: Intravenous  Airway Management Planned: Natural Airway  Additional Equipment:   Intra-op Plan:   Post-operative Plan:   Informed Consent: I have reviewed the patients History and Physical, chart, labs and discussed the procedure including the risks, benefits and alternatives for the proposed anesthesia with the patient or authorized representative who has indicated his/her understanding and acceptance.   Dental advisory given  Plan Discussed with: CRNA  Anesthesia Plan Comments: (Informed consent obtained prior to proceeding including risk of failure, 1% risk of PDPH, risk of minor discomfort and bruising.  Discussed rare but serious complications including epidural abscess, permanent nerve injury, epidural hematoma.  Discussed alternatives to epidural  analgesia and patient desires to proceed.  Timeout performed pre-procedure verifying patient name, procedure, and platelet count.  Patient tolerated procedure well.)        Anesthesia Quick Evaluation

## 2015-11-29 NOTE — Progress Notes (Signed)
[place] interpreter used ([place])

## 2015-11-29 NOTE — Progress Notes (Signed)
Video interpreter used to do admission teaching. Call bell explained, feedings explained, how to order food explained. Security paper explained. Questions answered.

## 2015-11-29 NOTE — Progress Notes (Signed)
Notified of pt arrival in MAU and advanced dilation. Will admit to labor and delivery

## 2015-11-29 NOTE — Progress Notes (Signed)
Dinner request called in for mom by RN - d/t Ltd English- speaking

## 2015-11-29 NOTE — Anesthesia Postprocedure Evaluation (Signed)
Anesthesia Post Note  Patient: Megan Benton  Procedure(s) Performed: * No procedures listed *  Patient location during evaluation: Mother Baby Anesthesia Type: Epidural Level of consciousness: awake and alert Pain management: pain level controlled Vital Signs Assessment: post-procedure vital signs reviewed and stable Respiratory status: spontaneous breathing, nonlabored ventilation and respiratory function stable Cardiovascular status: stable Postop Assessment: no headache, no backache and epidural receding Anesthetic complications: no     Last Vitals:  Vitals:   11/29/15 1000 11/29/15 1201  BP: 102/61 106/61  Pulse: 70 71  Resp: 18 18  Temp: 36.8 C 36.8 C    Last Pain:  Vitals:   11/29/15 1202  TempSrc:   PainSc: 3    Pain Goal:                 Junious Silk

## 2015-11-29 NOTE — Anesthesia Procedure Notes (Signed)
Epidural Patient location during procedure: OB  Staffing Anesthesiologist: Leiyah Maultsby  Preanesthetic Checklist Completed: patient identified, site marked, surgical consent, pre-op evaluation, timeout performed, IV checked, risks and benefits discussed and monitors and equipment checked  Epidural Patient position: sitting Prep: DuraPrep Patient monitoring: blood pressure and heart rate Approach: midline Location: L3-L4 Injection technique: LOR saline  Needle:  Needle type: Tuohy  Needle gauge: 17 G Needle length: 9 cm Needle insertion depth: 5 cm Catheter type: closed end flexible Catheter size: 19 Gauge Catheter at skin depth: 13 cm Test dose: negative and Other  Assessment Events: blood not aspirated, injection not painful, no injection resistance, negative IV test and no paresthesia  Additional Notes Reason for block:procedure for pain     

## 2015-11-29 NOTE — Lactation Note (Signed)
This note was copied from a baby's chart. Lactation Consultation Note  Patient Name: Megan Benton AJGOT'L Date: 11/29/2015 Reason for consult: Initial assessment Mom plans to breast/bottle. This is her 1st time BF. Basic teaching reviewed. Stressed importance of BF with each feeding before giving any bottles. Supplemental guidelines given to and reviewed with Mom. Encouraged Mom to BF with feeding ques. Lactation brochure left for review, advised of OP services and support group. Encouraged to call for assist as needed. When 1st arrived in room, Mom sleeping STS with baby. Woke Mom and discussed safe sleep. [place] [employer]preter  [phone] used for visit.   Maternal Data Has patient been taught Hand Expression?: Yes Does the patient have breastfeeding experience prior to this delivery?: No  Feeding    LATCH Score/Interventions                      Lactation Tools Discussed/Used WIC Program: Yes   Consult Status Consult Status: Follow-up Date: 11/30/15 Follow-up type: In-patient    Alfred Levins 11/29/2015, 4:39 PM

## 2015-11-30 LAB — CBC
HEMATOCRIT: 25 % — AB (ref 36.0–46.0)
Hemoglobin: 8.6 g/dL — ABNORMAL LOW (ref 12.0–15.0)
MCH: 24.7 pg — AB (ref 26.0–34.0)
MCHC: 34.4 g/dL (ref 30.0–36.0)
MCV: 71.8 fL — AB (ref 78.0–100.0)
PLATELETS: 189 10*3/uL (ref 150–400)
RBC: 3.48 MIL/uL — ABNORMAL LOW (ref 3.87–5.11)
RDW: 13.8 % (ref 11.5–15.5)
WBC: 14 10*3/uL — ABNORMAL HIGH (ref 4.0–10.5)

## 2015-11-30 MED ORDER — FENTANYL CITRATE (PF) 100 MCG/2ML IJ SOLN: 100.0000 ug | Freq: Once | INTRAMUSCULAR | Status: DC

## 2015-11-30 NOTE — Progress Notes (Signed)
Post Partum Day 1 Subjective: no complaints, up ad lib, voiding, tolerating PO and nl lochia, pain controlled  Objective: Blood pressure (!) 92/59, pulse 62, temperature 98.2 F (36.8 C), resp. rate 18, height 5\' 3"  (1.6 m), weight 68.9 kg (152 lb), SpO2 98 %, unknown if currently breastfeeding.  Physical Exam:  General: alert and no distress Lochia: appropriate Uterine Fundus: firm   Recent Labs  11/29/15 0410 11/30/15 0539  HGB 10.7* 8.6*  HCT 31.1* 25.0*    Assessment/Plan: Plan for discharge tomorrow, Breastfeeding and Lactation consult.  D/w pt circumcision for female infant - inc r/b/a and payment.     LOS: 1 day   Bovard-Stuckert, Rhonna Holster 11/30/2015, 7:07 AM

## 2015-11-30 NOTE — Lactation Note (Signed)
This note was copied from a baby's chart. Lactation Consultation Note  Upon entering room FOB happy to have assistance w/ breastfeeding. Mother seems to want to bottle formula feed.  With encouragement from FOB mother allowed LC to help w/ breastfeeding. Both parents concerned that mother has "no milk".  Hand expression reviewed and mother was able to express good flow of colostrum. Mother has large nipples and baby has small mouth.  Latching is challenging but mother did a good job compressing and helping baby sustain a latch as deep as possible. Reviewed stomach size and importance of breastfeeding before offering formula. Suggest breastfeeding on both breasts and then supplementing w/formula after breastfeed. Discussed supply and demand and benefits of breastfeeding.  Patient Name: Megan Benton HQION'G Date: 11/30/2015 Reason for consult: Follow-up assessment   Maternal Data    Feeding Feeding Type: Breast Fed Length of feed: 7 min  LATCH Score/Interventions Latch: Repeated attempts needed to sustain latch, nipple held in mouth throughout feeding, stimulation needed to elicit sucking reflex. Intervention(s): Waking techniques Intervention(s): Adjust position;Assist with latch;Breast massage  Audible Swallowing: None Intervention(s): Hand expression  Type of Nipple: Everted at rest and after stimulation  Comfort (Breast/Nipple): Soft / non-tender     Hold (Positioning): Assistance needed to correctly position infant at breast and maintain latch.  LATCH Score: 6  Lactation Tools Discussed/Used     Consult Status Consult Status: Follow-up Date: 12/01/15 Follow-up type: In-patient    Megan Benton [facility] 11/30/2015, 3:15 PM

## 2015-11-30 NOTE — Progress Notes (Signed)
Patient ID: Megan Benton, female   DOB: May 01, 1985, 30 y.o.   MRN: [id number] Pt with no fever since delivery, had been on ancef q 8 hours for manual removal of placenta, will d/c

## 2015-12-01 MED ORDER — IBUPROFEN 600 MG PO TABS: 600.0000 mg | ORAL_TABLET | Freq: Four times a day (QID) | ORAL | 0 refills | Status: DC

## 2015-12-01 NOTE — Discharge Instructions (Signed)
As per discharge pamphlet °

## 2015-12-01 NOTE — Progress Notes (Signed)
PPD #2 Doing well Afeb, VSS Fundus firm D/c home 

## 2015-12-01 NOTE — Lactation Note (Signed)
This note was copied from a baby's chart. Lactation Consultation Note  Patient Name: Megan Benton SEGBT'D Date: 12/01/2015 Reason for consult: Follow-up assessment   Follow up with mom of 52 hour old infant. Spoke with mother using [place] interpreter This # [id number]. Infant with 2 BF attempts, 10 bottle feeds of 12-20 cc, 3 voids and 4 stools in 24 hours preceding this assessment. Dad originally told me that mom was ok and did not need my assistance, I already had interpreter on the phone and mom started asking questions after I introduced myself.   Mom reports she plans to breast and bottle feed once she gets home. She reports she has BF another child. Reiterated BF prior to bottle feeding to initiate a supply. Mom said ok. Mom did not have a pump, gave her manual pump with instructions for use and cleaning. Mom asked questions about milk storage, reviewed milk storage chart in Taking Care of Baby and Me Booklet. Enc mom to give infant EBM if available.   Mom asking several questions in regards to formula and mixing formula, advised her to use formula that she has been using and to mix according to package directions.   Enc mom to call with questions/concerns.    Maternal Data Does the patient have breastfeeding experience prior to this delivery?: Yes  Feeding Feeding Type: Bottle Fed - Formula  LATCH Score/Interventions                      Lactation Tools Discussed/Used Pump Review: Milk Storage   Consult Status Consult Status: Complete Follow-up type: Call as needed    Ed Blalock 12/01/2015, 12:22 PM

## 2015-12-01 NOTE — Discharge Summary (Signed)
OB Discharge Summary     Patient Name: Megan Benton DOB: Jan 26, 1986 MRN: [id number]  Date of admission: 11/29/2015 Delivering MD: Pryor Ochoa [facility]   Date of discharge: 12/01/2015  Admitting diagnosis: 39 WEEKS CTX Intrauterine pregnancy: [redacted]w[redacted]d     Secondary diagnosis:  Active Problems:   Normal labor and delivery   SVD (spontaneous vaginal delivery)   Retained placenta or amniotic membrane after delivery without hemorrhage   Postpartum care following vaginal delivery  Additional problems: Chronic hepatitis B     Discharge diagnosis: Term Pregnancy Delivered                                                                                                Post partum procedures:manual removal of placenta  Augmentation: AROM  Complications: None  Hospital course:  Onset of Labor With Vaginal Delivery     30 y.o. yo [id number] at [redacted]w[redacted]d was admitted in Active Labor on 11/29/2015. Patient had an uncomplicated labor course as follows:  Membrane Rupture Time/Date: 5:23 AM ,11/29/2015   Intrapartum Procedures: Episiotomy: None [1]                                         Lacerations:     Patient had a delivery of a Viable infant. 11/29/2015  Information for the patient's newborn:  Chizuko, Trine [[id number]]  Delivery Method: Vaginal, Spontaneous Delivery (Filed from Delivery Summary)    Placenta required manual removal.  Pateint had an uncomplicated postpartum course.  She is ambulating, tolerating a regular diet, passing flatus, and urinating well. Patient is discharged home in stable condition on 12/01/15.    Physical exam Vitals:   11/29/15 2135 11/30/15 0542 11/30/15 1850 12/01/15 0547  BP: 107/68 (!) 92/59 100/61 103/62  Pulse: 78 62 72 62  Resp: Temp: 98.5 F (36.9 C) 98.2 F (36.8 C) 98.1 F (36.7 C) 98.2 F (36.8 C)  TempSrc: Oral  Oral Oral  SpO2:      Weight:      Height:       General: alert Lochia: appropriate Uterine Fundus: firm  Labs: Lab  Results  Component Value Date   WBC 14.0 (H) 11/30/2015   HGB 8.6 (L) 11/30/2015   HCT 25.0 (L) 11/30/2015   MCV 71.8 (L) 11/30/2015   PLT 189 11/30/2015   CMP Latest Ref Rng & Units 08/31/2015  Glucose 65 - 99 mg/dL 80  BUN 7 - 25 mg/dL 11  Creatinine 8.41 - 3.24 mg/dL 4.01  Sodium 027 - 253 mmol/L 135  Potassium 3.5 - 5.3 mmol/L 4.1  Chloride 98 - 110 mmol/L 103  CO2 20 - 31 mmol/L 24  Calcium 8.6 - 10.2 mg/dL 6.6(Y)  Total Protein 6.1 - 8.1 g/dL 6.8  Total Bilirubin 0.2 - 1.2 mg/dL 0.4  Alkaline Phos 33 - 115 U/L 54  AST 10 - 30 U/L 11  ALT 6 - 29 U/L 9    Discharge instruction: per After Visit Summary and "  Baby and Me Booklet".  After visit meds:    Medication List    TAKE these medications   ibuprofen 600 MG tablet Commonly known as:  ADVIL,MOTRIN Take 1 tablet (600 mg total) by mouth every 6 (six) hours.       Diet: routine diet  Activity: Advance as tolerated. Pelvic rest for 6 weeks.   Outpatient follow up:4 weeks Follow up Appt:Future Appointments Date Time Provider Department Center  12/08/2015 10:15 AM Judyann Munson, MD [facility]   Follow up Visit:No Follow-up on file.   Newborn Data: Live born female  Birth Weight: 6 lb 7 oz (2920 g) APGAR: 8, 9  Baby Feeding: Bottle Disposition:home with mother   12/01/2015 Zenaida Niece, MD

## 2015-12-08 ENCOUNTER — Ambulatory Visit: Payer: BLUE CROSS/BLUE SHIELD | Admitting: Internal Medicine

## 2017-04-10 ENCOUNTER — Telehealth: Payer: Self-pay | Admitting: *Deleted

## 2017-04-10 NOTE — Telephone Encounter (Signed)
Patient's husband walked into clinic asking if a referral was made for his wife. Spoke to Black & DeckerMinh Pham, pharmacist and he advised that patient needed a follow up with Dr. Drue SecondSnider. Patient scheduled with Dr. Drue SecondSnider for 05/09/17. Wendall MolaJacqueline Tamma Brigandi

## 2017-05-09 ENCOUNTER — Ambulatory Visit: Payer: BLUE CROSS/BLUE SHIELD | Admitting: Internal Medicine

## 2017-07-02 ENCOUNTER — Ambulatory Visit: Payer: BLUE CROSS/BLUE SHIELD | Admitting: Internal Medicine

## 2019-05-22 ENCOUNTER — Emergency Department (HOSPITAL_COMMUNITY)
Admission: EM | Admit: 2019-05-22 | Discharge: 2019-05-22 | Disposition: A | Discharge status: Home / Self Care | Payer: 59 | Attending: Emergency Medicine | Admitting: Emergency Medicine

## 2019-05-22 ENCOUNTER — Emergency Department (HOSPITAL_COMMUNITY): Payer: 59

## 2019-05-22 ENCOUNTER — Other Ambulatory Visit: Payer: Self-pay

## 2019-05-22 ENCOUNTER — Ambulatory Visit
Admission: EM | Admit: 2019-05-22 | Discharge: 2019-05-22 | Disposition: A | Discharge status: Home / Self Care | Payer: 59 | Source: Home / Self Care

## 2019-05-22 DIAGNOSIS — R1033 Periumbilical pain: Secondary | ICD-10-CM

## 2019-05-22 DIAGNOSIS — R1031 Right lower quadrant pain: Secondary | ICD-10-CM

## 2019-05-22 DIAGNOSIS — K529 Noninfective gastroenteritis and colitis, unspecified: Secondary | ICD-10-CM | POA: Diagnosis not present

## 2019-05-22 DIAGNOSIS — R1030 Lower abdominal pain, unspecified: Secondary | ICD-10-CM | POA: Diagnosis present

## 2019-05-22 LAB — COMPREHENSIVE METABOLIC PANEL
ALT: 31 U/L (ref 0–44)
AST: 20 U/L (ref 15–41)
Albumin: 3.8 g/dL (ref 3.5–5.0)
Alkaline Phosphatase: 58 U/L (ref 38–126)
Anion gap: 6 (ref 5–15)
BUN: 9 mg/dL (ref 6–20)
CO2: 28 mmol/L (ref 22–32)
Calcium: 8.6 mg/dL — ABNORMAL LOW (ref 8.9–10.3)
Chloride: 103 mmol/L (ref 98–111)
Creatinine, Ser: 0.71 mg/dL (ref 0.44–1.00)
GFR calc Af Amer: >60 mL/min (ref >60)
GFR calc non Af Amer: >60 mL/min (ref >60)
Glucose, Bld: 103 mg/dL — ABNORMAL HIGH (ref 70–99)
Potassium: 3.4 mmol/L — ABNORMAL LOW (ref 3.5–5.1)
Sodium: 137 mmol/L (ref 135–145)
Total Bilirubin: 0.7 mg/dL (ref 0.3–1.2)
Total Protein: 7.4 g/dL (ref 6.5–8.1)

## 2019-05-22 LAB — URINALYSIS, ROUTINE W REFLEX MICROSCOPIC
Bilirubin Urine: NEGATIVE
Glucose, UA: NEGATIVE mg/dL
Hgb urine dipstick: NEGATIVE
Ketones, ur: NEGATIVE mg/dL
Nitrite: NEGATIVE
Protein, ur: NEGATIVE mg/dL
Specific Gravity, Urine: 1.015 (ref 1.005–1.030)
pH: 7 (ref 5.0–8.0)

## 2019-05-22 LAB — CBC
HCT: 40.5 % (ref 36.0–46.0)
Hemoglobin: 13 g/dL (ref 12.0–15.0)
MCH: 25.6 pg — ABNORMAL LOW (ref 26.0–34.0)
MCHC: 32.1 g/dL (ref 30.0–36.0)
MCV: 79.9 fL — ABNORMAL LOW (ref 80.0–100.0)
Platelets: 253 10*3/uL (ref 150–400)
RBC: 5.07 MIL/uL (ref 3.87–5.11)
RDW: 13.1 % (ref 11.5–15.5)
WBC: 11.2 10*3/uL — ABNORMAL HIGH (ref 4.0–10.5)
nRBC: 0 % (ref 0.0–0.2)

## 2019-05-22 LAB — I-STAT BETA HCG BLOOD, ED (MC, WL, AP ONLY): I-stat hCG, quantitative: <5 m[IU]/mL (ref <5)

## 2019-05-22 LAB — LIPASE, BLOOD: Lipase: 26 U/L (ref 11–51)

## 2019-05-22 MED ORDER — METRONIDAZOLE 500 MG PO TABS: 500.0000 mg | ORAL_TABLET | Freq: Three times a day (TID) | ORAL | 0 refills | Status: DC

## 2019-05-22 MED ORDER — IOHEXOL 300 MG/ML  SOLN
100.0000 mL | Freq: Once | INTRAMUSCULAR | Status: AC | PRN
  Administered 2019-05-22: 16:00:00 100 mL via INTRAVENOUS

## 2019-05-22 MED ORDER — HYDROCODONE-ACETAMINOPHEN 5-325 MG PO TABS: 2.0000 | ORAL_TABLET | Freq: Four times a day (QID) | ORAL | 0 refills | Status: DC | PRN

## 2019-05-22 MED ORDER — SODIUM CHLORIDE (PF) 0.9 % IJ SOLN
INTRAMUSCULAR | Status: AC
  Filled 2019-05-22: qty 50

## 2019-05-22 MED ORDER — CIPROFLOXACIN HCL 500 MG PO TABS: 500.0000 mg | ORAL_TABLET | Freq: Two times a day (BID) | ORAL | 0 refills | Status: DC

## 2019-05-22 MED ORDER — MORPHINE SULFATE (PF) 4 MG/ML IV SOLN
4.0000 mg | Freq: Once | INTRAVENOUS | Status: AC
  Administered 2019-05-22: 4 mg via INTRAVENOUS
  Filled 2019-05-22: qty 1

## 2019-05-22 MED ORDER — CIPROFLOXACIN HCL 500 MG PO TABS
500.0000 mg | ORAL_TABLET | Freq: Once | ORAL | Status: AC
  Administered 2019-05-22: 18:00:00 500 mg via ORAL
  Filled 2019-05-22: qty 1

## 2019-05-22 MED ORDER — SODIUM CHLORIDE 0.9% FLUSH: 3.0000 mL | Freq: Once | INTRAVENOUS | Status: DC

## 2019-05-22 MED ORDER — METRONIDAZOLE IN NACL 5-0.79 MG/ML-% IV SOLN
500.0000 mg | Freq: Once | INTRAVENOUS | Status: AC
  Administered 2019-05-22: 18:00:00 500 mg via INTRAVENOUS
  Filled 2019-05-22: qty 100

## 2019-05-22 NOTE — ED Triage Notes (Addendum)
Sent here form [facility], 2 days of abd pain Rt Mid pain, no N/V/D. Kennon Portela # [phone]

## 2019-05-22 NOTE — Discharge Instructions (Addendum)
34 year old female comes in for 2 day history of periumbilical pain. This is intermittent, worse with movement. Denies fever, nausea, vomiting. Able to tolerate oral intake. Denies URI symptoms, shortness of breath, loss of taste/smell. She does have headache, fatigue, but feels this is due to dealing with the abdominal pain. Denies urinary changes, bowl habit changes (normal BM daily). Denies vaginal symptoms. IUD with no cycles.  Vitals wnl. She is sitting comfortably on exam table Heart: RRR Lungs: CTAB Abd: soft, +BS, tender to palpation of RLQ. LLQ causes periumbilical pain. Guarding without rebound. Negative psoas, obturator sign. Negative CVA tenderness.  Discussed unable to r/o appendicitis. However, given symptoms not sever, discussed work up with UA, pelvic exam, possible blood work with strict return precautions. Patient would like to go to the ED for full evaluation. Discharged in stable condition to the ED for further evaluation needed.

## 2019-05-22 NOTE — ED Provider Notes (Signed)
[facility]-EMERGENCY DEPT Provider Note   CSN: [id number] Arrival date & time: 05/22/19  1255     History Chief Complaint  Patient presents with  . Abdominal Pain    Darrien Laakso is a 34 y.o. female.  34 year old female presents with 2 days of suprapubic as well as right lower quadrant abdominal pain.  Has had no nausea vomiting or diarrhea.  No fever or chills.  No prior history of same.  Denies any vaginal bleeding or discharge.  No urinary symptoms.  Went to urgent care and sent here for further management.  No treatment use prior to arrival.        Past Medical History:  Diagnosis Date  . Hepatitis B     Patient Active Problem List   Diagnosis Date Noted  . Normal labor and delivery 11/29/2015  . SVD (spontaneous vaginal delivery) 11/29/2015  . Retained placenta or amniotic membrane after delivery without hemorrhage 11/29/2015  . Postpartum care following vaginal delivery 11/29/2015  . Hepatitis B virus infection 08/31/2015    Past Surgical History:  Procedure Laterality Date  . NO PAST SURGERIES       OB History    Gravida  4   Para  2   Term  2   Preterm      AB  1   Living  2     SAB  1   TAB      Ectopic      Multiple  0   Live Births  1           No family history on file.  Social History   Tobacco Use  . Smoking status: Never Smoker  . Smokeless tobacco: Never Used  Substance Use Topics  . Alcohol use: No  . Drug use: No    Home Medications Prior to Admission medications   Not on File    Allergies    Patient has no known allergies.  Review of Systems   Review of Systems  All other systems reviewed and are negative.   Physical Exam Updated Vital Signs BP 114/79 (BP Location: Left Arm)   Pulse 88   Resp 18   Ht 1.626 m (5\' 4" )   Wt 62.6 kg   SpO2 98%   BMI 23.69 kg/m   Physical Exam Vitals and nursing note reviewed.  Constitutional:      General: She is not in acute distress.  Appearance: Normal appearance. She is well-developed. She is not toxic-appearing.  HENT:     Head: Normocephalic and atraumatic.  Eyes:     General: Lids are normal.     Conjunctiva/sclera: Conjunctivae normal.     Pupils: Pupils are equal, round, and reactive to light.  Neck:     Thyroid: No thyroid mass.     Trachea: No tracheal deviation.  Cardiovascular:     Rate and Rhythm: Normal rate and regular rhythm.     Heart sounds: Normal heart sounds. No murmur. No gallop.   Pulmonary:     Effort: Pulmonary effort is normal. No respiratory distress.     Breath sounds: Normal breath sounds. No stridor. No decreased breath sounds, wheezing, rhonchi or rales.  Abdominal:     General: Bowel sounds are normal. There is no distension.     Palpations: Abdomen is soft.     Tenderness: There is no abdominal tenderness. There is guarding. There is no rebound.    Musculoskeletal:  General: No tenderness. Normal range of motion.     Cervical back: Normal range of motion and neck supple.  Skin:    General: Skin is warm and dry.     Findings: No abrasion or rash.  Neurological:     Mental Status: She is alert and oriented to person, place, and time.     GCS: GCS eye subscore is 4. GCS verbal subscore is 5. GCS motor subscore is 6.     Cranial Nerves: No cranial nerve deficit.     Sensory: No sensory deficit.  Psychiatric:        Speech: Speech normal.        Behavior: Behavior normal.     ED Results / Procedures / Treatments   Labs (all labs ordered are listed, but only abnormal results are displayed) Labs Reviewed  LIPASE, BLOOD  COMPREHENSIVE METABOLIC PANEL  CBC  URINALYSIS, ROUTINE W REFLEX MICROSCOPIC  I-STAT BETA HCG BLOOD, ED ([facility], [facility], [facility] ONLY)    EKG None  Radiology No results found.  Procedures Procedures (including critical care time)  Medications Ordered in ED Medications  sodium chloride flush (NS) 0.9 % injection 3 mL (has no administration in time range)     ED Course  I have reviewed the triage vital signs and the nursing notes.  Pertinent labs & imaging results that were available during my care of the patient were reviewed by me and considered in my medical decision making (see chart for details).    MDM Rules/Calculators/A&P                      Patient medicated for pain here abdominal CT pending.  Care turned over to Dr. Wilson Singer Final Clinical Impression(s) / ED Diagnoses Final diagnoses:  None    Rx / DC Orders ED Discharge Orders    None       Lacretia Leigh, MD 05/22/19 [id number]

## 2019-05-22 NOTE — ED Triage Notes (Signed)
Pt c/o abdominal to umbilical area only x2 days. States pain on movement. Denies n/v/d

## 2019-05-22 NOTE — ED Provider Notes (Signed)
[name]    CSN: [id number] Arrival date & time: 05/22/19  1038      History   Chief Complaint Chief Complaint  Patient presents with  . Abdominal Pain    HPI Megan Benton is a 34 y.o. female.   34 year old female comes in for 2 day history of periumbilical pain, headache, fatigue. HPI obtained by patient through video translator. Periumbilical pain is intermittent, cramping in sensation, worse with movement. Denies nausea, vomiting, diarrhea. Last BM yesterday, denies straining. States she feels that she has headache and fatigue due to dealing with the abdominal pain. Denies URI symptoms such as cough, congestion, sore throat. Denies fever, chills, body aches. Denies shortness of breath, loss of taste/smell. IUD without cycles. Denies vaginal itching, discharge. Denies urinary symptoms such as frequency, dysuria, hematuria. Never smoker.      Past Medical History:  Diagnosis Date  . Hepatitis B     Patient Active Problem List   Diagnosis Date Noted  . Normal labor and delivery 11/29/2015  . SVD (spontaneous vaginal delivery) 11/29/2015  . Retained placenta or amniotic membrane after delivery without hemorrhage 11/29/2015  . Postpartum care following vaginal delivery 11/29/2015  . Hepatitis B virus infection 08/31/2015    Past Surgical History:  Procedure Laterality Date  . NO PAST SURGERIES      OB History    Gravida  4   Para  2   Term  2   Preterm      AB  1   Living  2     SAB  1   TAB      Ectopic      Multiple  0   Live Births  1            Home Medications    Prior to Admission medications   Not on File    Family History History reviewed. No pertinent family history.  Social History Social History   Tobacco Use  . Smoking status: Never Smoker  . Smokeless tobacco: Never Used  Substance Use Topics  . Alcohol use: No  . Drug use: No     Allergies   Patient has no known allergies.   Review of  Systems Review of Systems  Reason unable to perform ROS: See HPI as above.     Physical Exam Triage Vital Signs ED Triage Vitals  Enc Vitals Group     BP 05/22/19 1049 110/75     Pulse Rate 05/22/19 1049 85     Resp 05/22/19 1049 16     Temp 05/22/19 1049 99.1 F (37.3 C)     Temp Source 05/22/19 1049 Oral     SpO2 05/22/19 1049 95 %     Weight --      Height --      Head Circumference --      Peak Flow --      Pain Score 05/22/19 1119 7     Pain Loc --      Pain Edu? --      Excl. in GC? --    No data found.  Updated Vital Signs BP 110/75 (BP Location: Right Arm)   Pulse 85   Temp 99.1 F (37.3 C) (Oral)   Resp 16   SpO2 95%   Breastfeeding No   Physical Exam Constitutional:      General: She is not in acute distress.    Appearance: She is well-developed. She is not ill-appearing, toxic-appearing  or diaphoretic.  HENT:     Head: Normocephalic and atraumatic.  Eyes:     Conjunctiva/sclera: Conjunctivae normal.     Pupils: Pupils are equal, round, and reactive to light.  Cardiovascular:     Rate and Rhythm: Normal rate and regular rhythm.  Pulmonary:     Effort: Pulmonary effort is normal. No respiratory distress.     Comments: LCTAB Abdominal:     General: Bowel sounds are normal.     Palpations: Abdomen is soft.     Tenderness: There is abdominal tenderness in the right lower quadrant and periumbilical area. There is guarding. There is no right CVA tenderness, left CVA tenderness or rebound. Positive signs include McBurney's sign. Negative signs include psoas sign and obturator sign.     Comments: Pain to the umbilicus when palpating LLQ  Musculoskeletal:     Cervical back: Normal range of motion and neck supple.  Skin:    General: Skin is warm and dry.  Neurological:     Mental Status: She is alert and oriented to person, place, and time.  Psychiatric:        Behavior: Behavior normal.        Judgment: Judgment normal.    UC Treatments / Results   Labs (all labs ordered are listed, but only abnormal results are displayed) Labs Reviewed - No data to display  EKG   Radiology No results found.  Procedures Procedures (including critical care time)  Medications Ordered in UC Medications - No data to display  Initial Impression / Assessment and Plan / UC Course  I have reviewed the triage vital signs and the nursing notes.  Pertinent labs & imaging results that were available during my care of the patient were reviewed by me and considered in my medical decision making (see chart for details).    34 year old female comes in for 2 day history of periumbilical pain. This is intermittent, worse with movement. Denies fever, nausea, vomiting. Able to tolerate oral intake. Denies URI symptoms, shortness of breath, loss of taste/smell. She does have headache, fatigue, but feels this is due to dealing with the abdominal pain. Denies urinary changes, bowl habit changes (normal BM daily). Denies vaginal symptoms. IUD with no cycles.  Vitals wnl. She is sitting comfortably on exam table Heart: RRR Lungs: CTAB Abd: soft, +BS, tender to palpation of RLQ. LLQ causes periumbilical pain. Guarding without rebound. Negative psoas, obturator sign. Negative CVA tenderness.  Discussed unable to r/o appendicitis. However, given symptoms not severe and without fever/nausea/vomiting, discussed work up with UA, pelvic exam, possible blood work with strict return precautions. Patient would like to go to the ED for full evaluation. Discharged in stable condition to the ED for further evaluation needed.   Final Clinical Impressions(s) / UC Diagnoses   Final diagnoses:  RLQ abdominal pain  Periumbilical pain   ED Prescriptions    None     PDMP not reviewed this encounter.   Ok Edwards, PA-C 05/22/19 1157

## 2019-05-22 NOTE — Discharge Instructions (Signed)
You have inflammation of your ileum (small bowel). Take antibiotics until finished and pain medication as needed. Follow-up with your family doctor in 1-2 weeks.

## 2019-10-19 ENCOUNTER — Ambulatory Visit
Admission: EM | Admit: 2019-10-19 | Discharge: 2019-10-19 | Disposition: A | Discharge status: Home / Self Care | Payer: 59 | Attending: Physician Assistant | Admitting: Physician Assistant

## 2019-10-19 ENCOUNTER — Other Ambulatory Visit: Payer: Self-pay

## 2019-10-19 DIAGNOSIS — R399 Unspecified symptoms and signs involving the genitourinary system: Secondary | ICD-10-CM | POA: Diagnosis not present

## 2019-10-19 LAB — POCT URINALYSIS DIP (MANUAL ENTRY)
Bilirubin, UA: NEGATIVE
Glucose, UA: NEGATIVE mg/dL
Ketones, POC UA: NEGATIVE mg/dL
Nitrite, UA: NEGATIVE
Protein Ur, POC: 100 mg/dL — AB
Spec Grav, UA: 1.02 (ref 1.010–1.025)
Urobilinogen, UA: 1 E.U./dL
pH, UA: 7 (ref 5.0–8.0)

## 2019-10-19 MED ORDER — CEPHALEXIN 500 MG PO CAPS: 500.0000 mg | ORAL_CAPSULE | Freq: Two times a day (BID) | ORAL | 0 refills | Status: DC

## 2019-10-19 NOTE — Discharge Instructions (Signed)
Urine had a little bacteria in it. Will start keflex to cover for urinary tract infection. Keep hydrated, urine should be clear to pale yellow in color. Urine culture sent, we will let you know if medicines need to be changed. Monitor for any worsening of symptoms, fever, worsening abdominal pain, nausea/vomiting, flank pain, follow up for reevaluation.

## 2019-10-19 NOTE — ED Triage Notes (Signed)
Pt c/o burning on urination with pain and frequency x1wk. States took something OTC with no relief. Denies vaginal discharge or odor.

## 2019-10-19 NOTE — ED Provider Notes (Signed)
[name]    CSN: [id number] Arrival date & time: 10/19/19  1039      History   Chief Complaint Chief Complaint  Patient presents with   Urinary Tract Infection    HPI Megan Benton is a 34 y.o. female.   34 year old female comes in for 1 week history urinary symptoms. HPI obtained by patient through video translator. Has had dysuria, urinary frequency. Denies hematuria. Denies abdominal pain, nausea, vomiting. Denies fever, chills, flank/back pain. Denies vaginal discharge, itching, spotting. IUD. Took otc medicines with good relief, but symptoms returned and therefore came in for evaluation.      Past Medical History:  Diagnosis Date   Hepatitis B     Patient Active Problem List   Diagnosis Date Noted   Normal labor and delivery 11/29/2015   SVD (spontaneous vaginal delivery) 11/29/2015   Retained placenta or amniotic membrane after delivery without hemorrhage 11/29/2015   Postpartum care following vaginal delivery 11/29/2015   Hepatitis B virus infection 08/31/2015    Past Surgical History:  Procedure Laterality Date   NO PAST SURGERIES      OB History    Gravida  4   Para  2   Term  2   Preterm      AB  1   Living  2     SAB  1   TAB      Ectopic      Multiple  0   Live Births  1            Home Medications    Prior to Admission medications   Medication Sig Start Date End Date Taking? Authorizing Provider  cephALEXin (KEFLEX) 500 MG capsule Take 1 capsule (500 mg total) by mouth 2 (two) times daily. 10/19/19   Belinda Fisher, PA-C    Family History History reviewed. No pertinent family history.  Social History Social History   Tobacco Use   Smoking status: Never Smoker   Smokeless tobacco: Never Used  Substance Use Topics   Alcohol use: No   Drug use: No     Allergies   Patient has no known allergies.   Review of Systems Review of Systems  Reason unable to perform ROS: See HPI as above.      Physical Exam Triage Vital Signs ED Triage Vitals [10/19/19 1051]  Enc Vitals Group     BP 113/76     Pulse Rate 81     Resp 16     Temp 98.5 F (36.9 C)     Temp Source Oral     SpO2 96 %     Weight      Height      Head Circumference      Peak Flow      Pain Score 3     Pain Loc      Pain Edu?      Excl. in GC?    No data found.  Updated Vital Signs BP 113/76 (BP Location: Left Arm)    Pulse 81    Temp 98.5 F (36.9 C) (Oral)    Resp 16    SpO2 96%   Physical Exam Constitutional:      General: She is not in acute distress.    Appearance: She is well-developed. She is not ill-appearing, toxic-appearing or diaphoretic.  HENT:     Head: Normocephalic and atraumatic.  Eyes:     Conjunctiva/sclera: Conjunctivae normal.  Pupils: Pupils are equal, round, and reactive to light.  Cardiovascular:     Rate and Rhythm: Normal rate and regular rhythm.  Pulmonary:     Effort: Pulmonary effort is normal. No respiratory distress.     Comments: LCTAB Abdominal:     General: Bowel sounds are normal.     Palpations: Abdomen is soft.     Tenderness: There is no abdominal tenderness. There is no right CVA tenderness, left CVA tenderness, guarding or rebound.  Musculoskeletal:     Cervical back: Normal range of motion and neck supple.  Skin:    General: Skin is warm and dry.  Neurological:     Mental Status: She is alert and oriented to person, place, and time.  Psychiatric:        Behavior: Behavior normal.        Judgment: Judgment normal.      UC Treatments / Results  Labs (all labs ordered are listed, but only abnormal results are displayed) Labs Reviewed  POCT URINALYSIS DIP (MANUAL ENTRY) - Abnormal; Notable for the following components:      Result Value   Clarity, UA cloudy (*)    Blood, UA small (*)    Protein Ur, POC =100 (*)    Leukocytes, UA Small (1+) (*)    All other components within normal limits  URINE CULTURE    EKG   Radiology No  results found.  Procedures Procedures (including critical care time)  Medications Ordered in UC Medications - No data to display  Initial Impression / Assessment and Plan / UC Course  I have reviewed the triage vital signs and the nursing notes.  Pertinent labs & imaging results that were available during my care of the patient were reviewed by me and considered in my medical decision making (see chart for details).    Urine dipstick with small blood and leuks, will cover for UTI with keflex. Push fluids. Return precautions given.  Final Clinical Impressions(s) / UC Diagnoses   Final diagnoses:  Urinary tract infection symptoms    ED Prescriptions    Medication Sig Dispense Auth. Provider   cephALEXin (KEFLEX) 500 MG capsule Take 1 capsule (500 mg total) by mouth 2 (two) times daily. 10 capsule Ok Edwards, PA-C     PDMP not reviewed this encounter.   Ok Edwards, PA-C 10/19/19 1109

## 2019-10-21 LAB — URINE CULTURE: Culture: >=100000 — AB

## 2020-01-11 ENCOUNTER — Ambulatory Visit
Admission: EM | Admit: 2020-01-11 | Discharge: 2020-01-11 | Disposition: A | Discharge status: Home / Self Care | Payer: 59 | Attending: Physician Assistant | Admitting: Physician Assistant

## 2020-01-11 DIAGNOSIS — J029 Acute pharyngitis, unspecified: Secondary | ICD-10-CM | POA: Diagnosis not present

## 2020-01-11 LAB — POCT RAPID STREP A (OFFICE): Rapid Strep A Screen: NEGATIVE

## 2020-01-11 MED ORDER — LIDOCAINE VISCOUS HCL 2 % MT SOLN: 10.0000 mL | OROMUCOSAL | 0 refills | Status: AC | PRN

## 2020-01-11 MED ORDER — NAPROXEN 375 MG PO TABS: 375.0000 mg | ORAL_TABLET | Freq: Two times a day (BID) | ORAL | 0 refills | Status: AC | PRN

## 2020-01-11 NOTE — Discharge Instructions (Addendum)
Start Naproxen twice a day as needed for pain. Use Viscous lidocaine 2 teaspoons every 4 hours as needed- spit out, do not swallow. Follow-up in 4 to 5 days if not resolving.

## 2020-01-11 NOTE — ED Provider Notes (Signed)
[name]    CSN: [id number] Arrival date & time: 01/11/20  1120      History   Chief Complaint Chief Complaint  Patient presents with   Sore Throat    HPI Megan Benton is a 34 y.o. female.   34 year old female presents with sore throat for over 1 week. Having pain with swallowing and getting worse. Denies any fever, nasal congestion, cough or GI symptoms. No known exposure to strep. No other family members ill. Has tried OTC Ibuprofen with minimal relief. No other chronic health issues. Takes no daily medication.   The history is provided by the patient. The history is limited by a language barrier. A language interpreter was used Midwife used).    Past Medical History:  Diagnosis Date   Hepatitis B     Patient Active Problem List   Diagnosis Date Noted   Normal labor and delivery 11/29/2015   SVD (spontaneous vaginal delivery) 11/29/2015   Retained placenta or amniotic membrane after delivery without hemorrhage 11/29/2015   Postpartum care following vaginal delivery 11/29/2015   Hepatitis B virus infection 08/31/2015    Past Surgical History:  Procedure Laterality Date   NO PAST SURGERIES      OB History    Gravida  4   Para  2   Term  2   Preterm      AB  1   Living  2     SAB  1   TAB      Ectopic      Multiple  0   Live Births  1            Home Medications    Prior to Admission medications   Medication Sig Start Date End Date Taking? Authorizing Provider  lidocaine (XYLOCAINE) 2 % solution Use as directed 10 mLs in the mouth or throat every 4 (four) hours as needed (throat pain). Swish and spit out- do not swallow 01/11/20   Sudie Grumbling, NP  naproxen (NAPROSYN) 375 MG tablet Take 1 tablet (375 mg total) by mouth 2 (two) times daily as needed for moderate pain. 01/11/20   Sudie Grumbling, NP    Family History History reviewed. No pertinent family history.  Social History Social  History   Tobacco Use   Smoking status: Never Smoker   Smokeless tobacco: Never Used  Substance Use Topics   Alcohol use: No   Drug use: No     Allergies   Patient has no known allergies.   Review of Systems Review of Systems  Constitutional: Negative for activity change, appetite change, chills, fatigue and fever.  HENT: Positive for sore throat and trouble swallowing. Negative for congestion, ear discharge, ear pain, facial swelling, mouth sores, nosebleeds, postnasal drip, rhinorrhea, sinus pressure, sinus pain and sneezing.   Eyes: Negative for pain, discharge, redness and itching.  Respiratory: Negative for cough, chest tightness, shortness of breath and wheezing.   Gastrointestinal: Negative for diarrhea, nausea and vomiting.  Musculoskeletal: Negative for arthralgias, myalgias, neck pain and neck stiffness.  Skin: Negative for color change, rash and wound.  Allergic/Immunologic: Negative for environmental allergies, food allergies and immunocompromised state.  Neurological: Negative for dizziness, seizures, syncope, light-headedness and headaches.  Hematological: Negative for adenopathy. Does not bruise/bleed easily.     Physical Exam Triage Vital Signs ED Triage Vitals  Enc Vitals Group     BP 01/11/20 1150 118/73     Pulse Rate 01/11/20 1150  80     Resp 01/11/20 1150 16     Temp 01/11/20 1150 98.7 F (37.1 C)     Temp Source 01/11/20 1150 Oral     SpO2 01/11/20 1150 98 %     Weight --      Height --      Head Circumference --      Peak Flow --      Pain Score 01/11/20 1151 8     Pain Loc --      Pain Edu? --      Excl. in GC? --    No data found.  Updated Vital Signs BP 118/73 (BP Location: Right Arm)    Pulse 80    Temp 98.7 F (37.1 C) (Oral)    Resp 16    SpO2 98%   Visual Acuity Right Eye Distance:   Left Eye Distance:   Bilateral Distance:    Right Eye Near:   Left Eye Near:    Bilateral Near:     Physical Exam Vitals and nursing  note reviewed.  Constitutional:      General: She is awake. She is not in acute distress.    Appearance: She is well-developed and well-groomed.     Comments: She is sitting comfortably on the exam table in no acute distress.   HENT:     Head: Normocephalic and atraumatic.     Right Ear: Hearing, tympanic membrane, ear canal and external ear normal.     Left Ear: Hearing, tympanic membrane, ear canal and external ear normal.     Nose: Nose normal. No congestion or rhinorrhea.     Right Sinus: No maxillary sinus tenderness or frontal sinus tenderness.     Left Sinus: No maxillary sinus tenderness or frontal sinus tenderness.     Mouth/Throat:     Lips: Pink.     Mouth: Mucous membranes are moist. No oral lesions.     Pharynx: Oropharynx is clear. Uvula midline. Posterior oropharyngeal erythema present. No pharyngeal swelling, oropharyngeal exudate or uvula swelling.     Comments: Posterior pharynx very red but no exudate or drainage. No ulcers seen.  Eyes:     Extraocular Movements: Extraocular movements intact.     Conjunctiva/sclera: Conjunctivae normal.  Neck:     Comments: Slight bilateral anterior cervical lymph tenderness present but no lymphadenopathy.  Cardiovascular:     Rate and Rhythm: Normal rate and regular rhythm.     Heart sounds: Normal heart sounds. No murmur heard.   Pulmonary:     Effort: Pulmonary effort is normal. No respiratory distress.     Breath sounds: Normal breath sounds and air entry. No decreased air movement. No decreased breath sounds, wheezing, rhonchi or rales.  Musculoskeletal:        General: Normal range of motion.     Cervical back: Normal range of motion and neck supple. Tenderness present.  Lymphadenopathy:     Cervical: No cervical adenopathy.  Skin:    General: Skin is warm and dry.     Findings: No rash.  Neurological:     General: No focal deficit present.     Mental Status: She is alert and oriented to person, place, and time.    Psychiatric:        Mood and Affect: Mood normal.        Behavior: Behavior normal. Behavior is cooperative.      UC Treatments / Results  Labs (all labs ordered are listed, but  only abnormal results are displayed) Labs Reviewed  CULTURE, GROUP A STREP [facility])  POCT RAPID STREP A (OFFICE)    EKG   Radiology No results found.  Procedures Procedures (including critical care time)  Medications Ordered in UC Medications - No data to display  Initial Impression / Assessment and Plan / UC Course  I have reviewed the triage vital signs and the nursing notes.  Pertinent labs & imaging results that were available during my care of the patient were reviewed by me and considered in my medical decision making (see chart for details).    Reviewed negative rapid strep test with patient- will send culture to confirm. Discussed that she probably has a viral illness. Recommend Viscous lidocaine 2 teaspoons swish and spit out every 3 to 4 hours as needed. May also take Naproxen 375mg  every 12 hours as needed for throat pain. Recommend follow-up pending throat culture results and in 4 to 5 days if not resolving.   Final Clinical Impressions(s) / UC Diagnoses   Final diagnoses:  Acute pharyngitis, unspecified etiology  Acute sore throat     Discharge Instructions     Start Naproxen twice a day as needed for pain. Use Viscous lidocaine 2 teaspoons every 4 hours as needed- spit out, do not swallow. Follow-up in 4 to 5 days if not resolving.     ED Prescriptions    Medication Sig Dispense Auth. Provider   lidocaine (XYLOCAINE) 2 % solution Use as directed 10 mLs in the mouth or throat every 4 (four) hours as needed (throat pain). Swish and spit out- do not swallow 150 mL , NP   naproxen (NAPROSYN) 375 MG tablet Take 1 tablet (375 mg total) by mouth 2 (two) times daily as needed for moderate pain. 20 tablet Simrin Vegh, Sudie Grumbling, NP     PDMP not reviewed this encounter.    Ali Lowe, NP 01/11/20 2006

## 2020-01-11 NOTE — ED Triage Notes (Signed)
Pt present sore throat for over a week ago. Pt is having difficulty swallowing.

## 2020-01-14 LAB — CULTURE, GROUP A STREP (THRC)

## 2020-11-21 IMAGING — CT CT ABD-PELV W/ CM
2 of 4 series · 16 of 46 positions shown, 18 images · IV contrast (omnipaque)
Comparison: None.

CLINICAL DATA: Suprapubic and right lower quadrant pain

EXAM:
CT ABDOMEN AND PELVIS WITH CONTRAST
TECHNIQUE: Multidetector CT imaging of the abdomen and pelvis was performed
using the standard protocol following bolus administration of
intravenous contrast.
CONTRAST:  100mL OMNIPAQUE IOHEXOL 300 MG/ML  SOLN

[Series 2: axial st · axial · 0.63mm/px · z∈[-442,-42]mm · 13 of 88 slices shown, 15 images]
[im 4/88  soft-tissue]
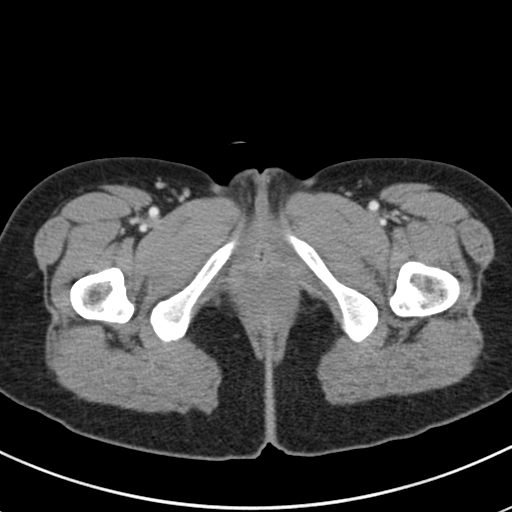
[im 4/88  bone]
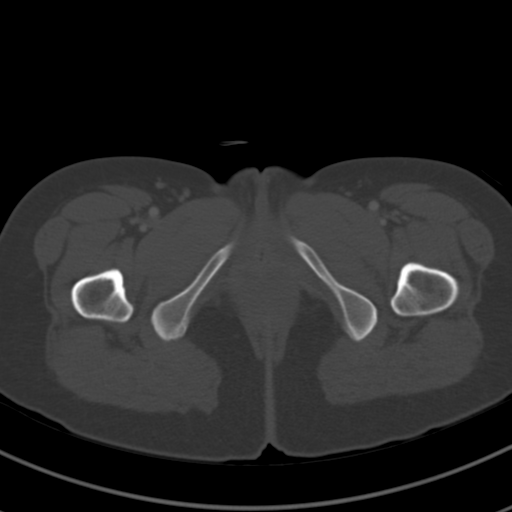
[im 12/88  soft-tissue]
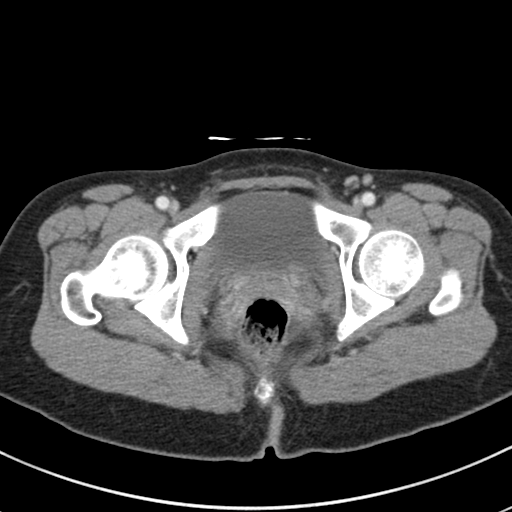
[im 20/88  soft-tissue]
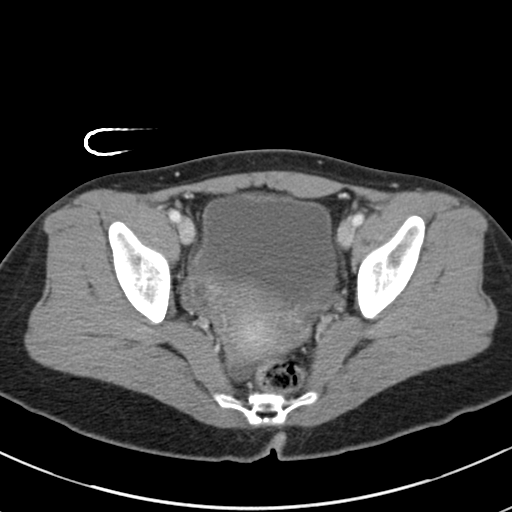
[im 24/88  soft-tissue]
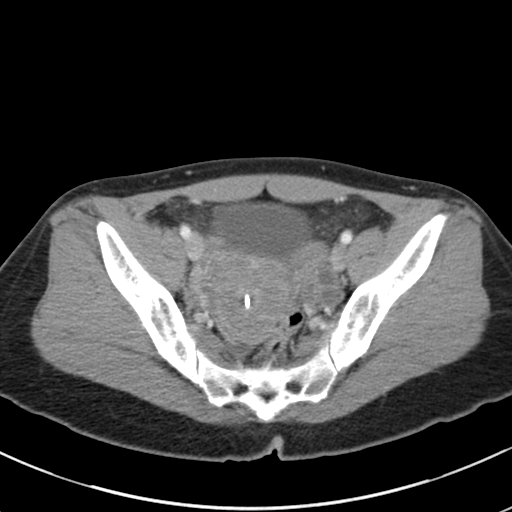
[im 32/88  soft-tissue]
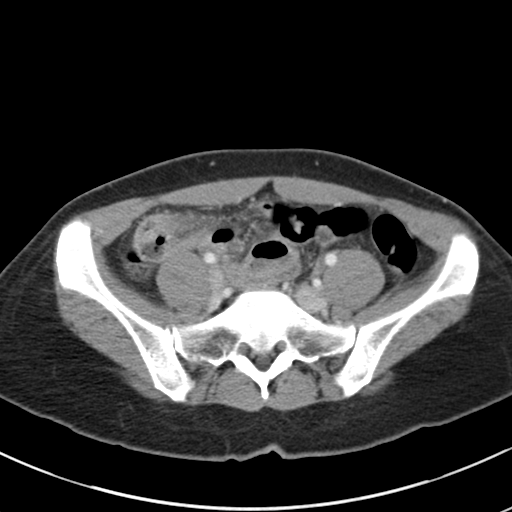
[im 36/88  soft-tissue]
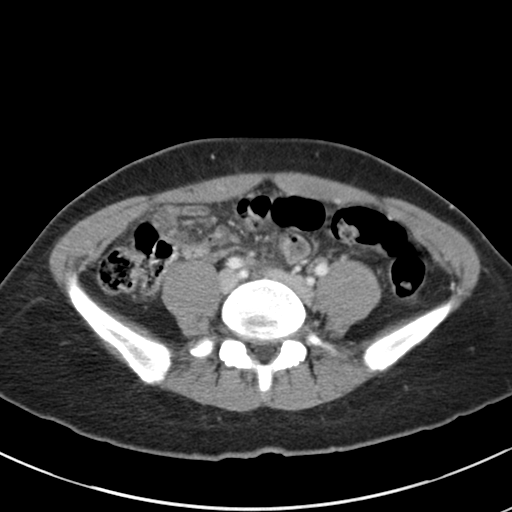
[im 44/88  soft-tissue]
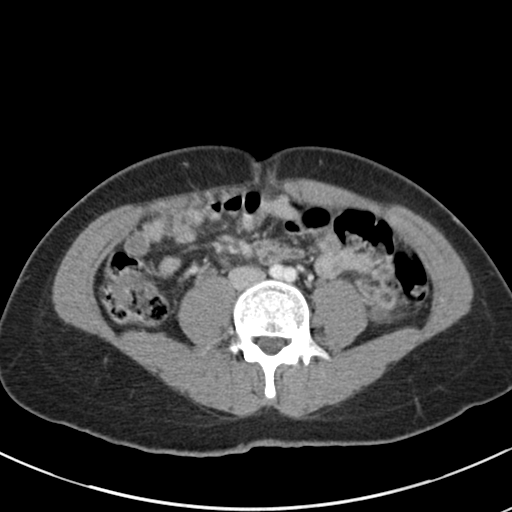
[im 52/88  soft-tissue]
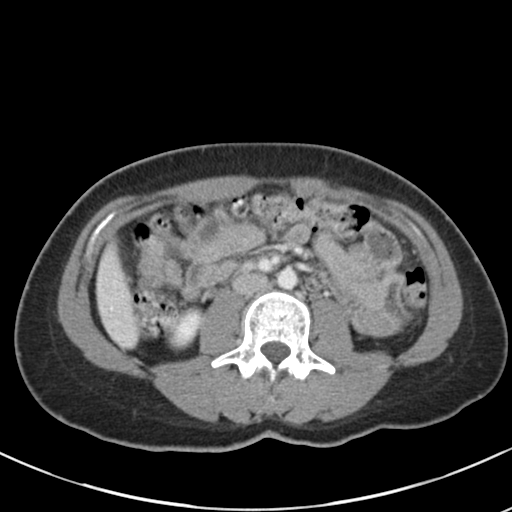
[im 56/88  soft-tissue]
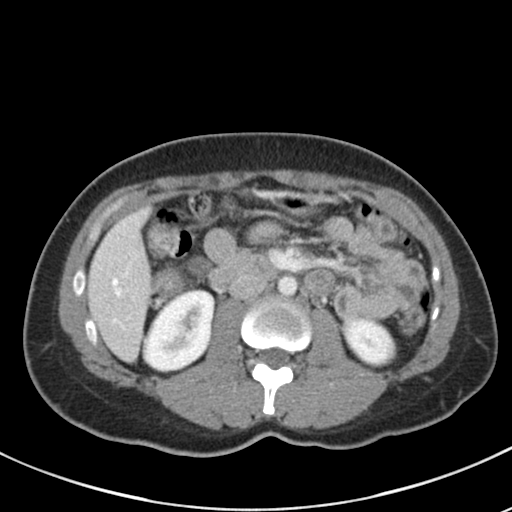
[im 56/88  bone]
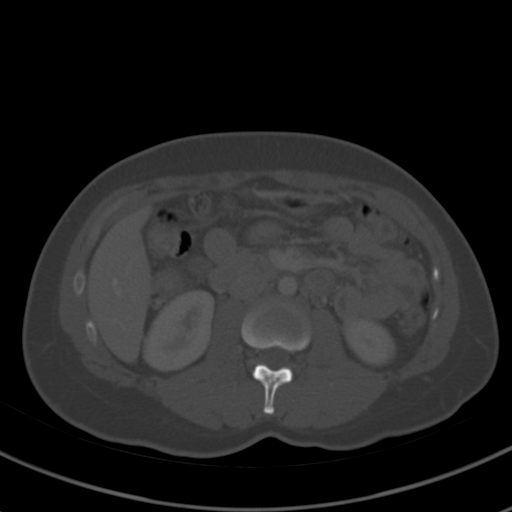
[im 64/88  soft-tissue]
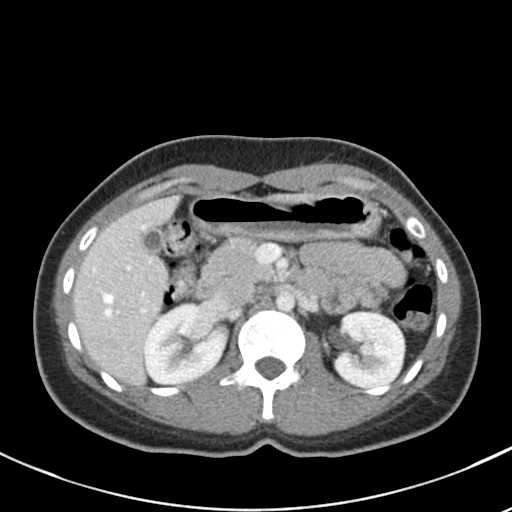
[im 68/88  soft-tissue]
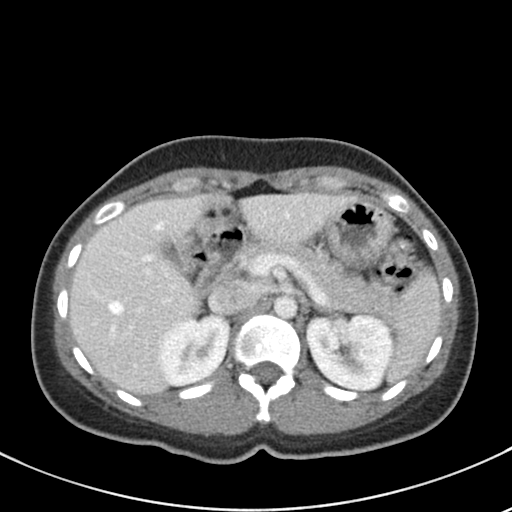
[im 76/88  soft-tissue]
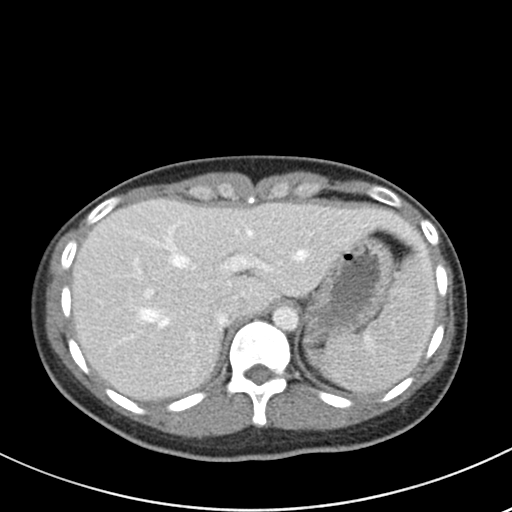
[im 84/88  soft-tissue]
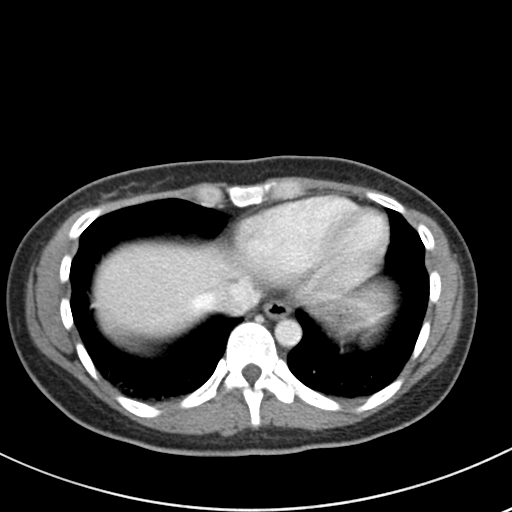

[Series 5: coronal st · coronal · 0.63mm/px · 3 of 102 slices shown]
[im 34/102  soft-tissue]
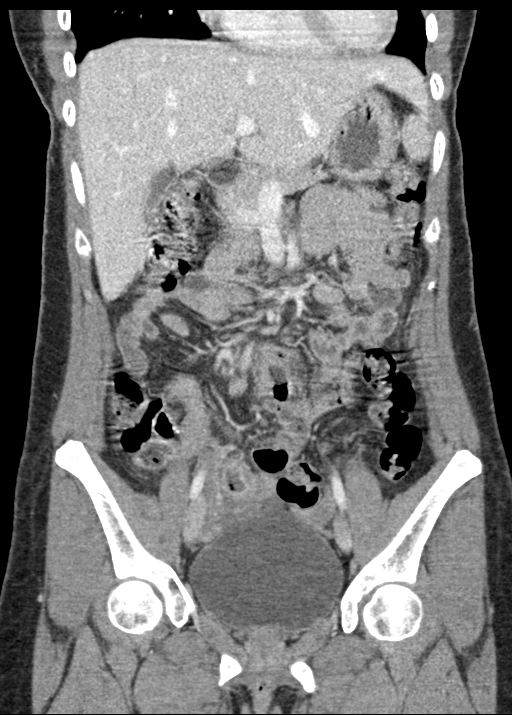
[im 45/102  soft-tissue]
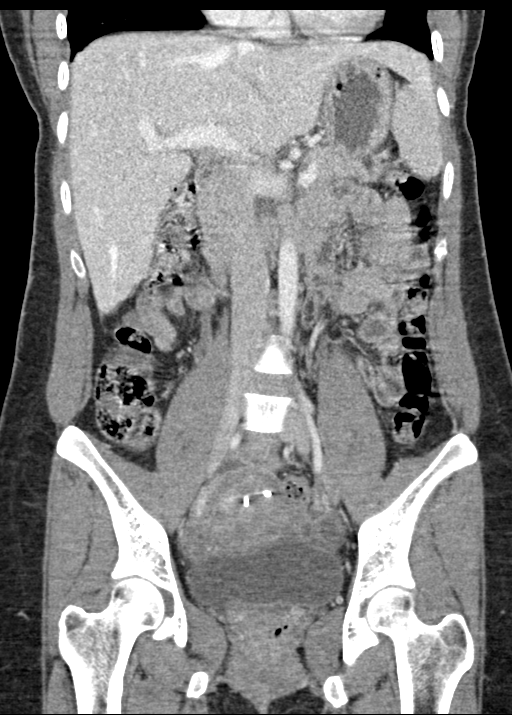
[im 57/102  soft-tissue]
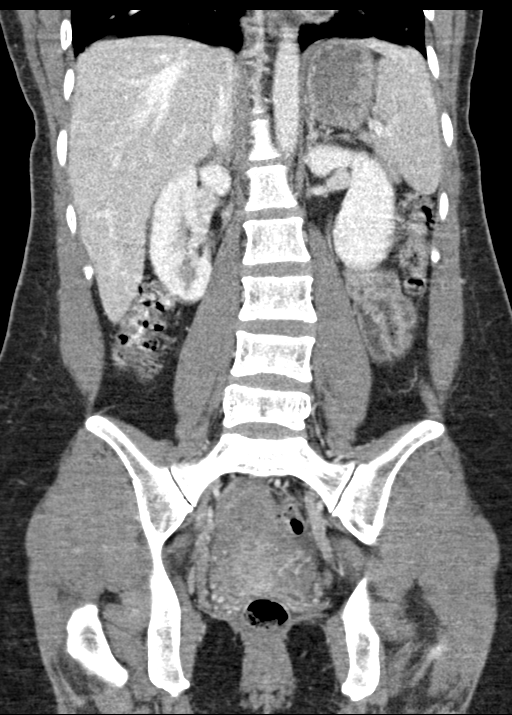

[16 of 46 positions shown; findings below may reference images not displayed]

FINDINGS: Lower chest: Lung bases demonstrate no acute consolidation or
effusion. Normal heart size

Hepatobiliary: No focal liver abnormality is seen. No gallstones,
gallbladder wall thickening, or biliary dilatation.

Pancreas: Unremarkable. No pancreatic ductal dilatation or
surrounding inflammatory changes.

Spleen: Normal in size without focal abnormality.

Adrenals/Urinary Tract: Adrenal glands are unremarkable. Kidneys are
normal, without renal calculi, focal lesion, or hydronephrosis.
Bladder is unremarkable.

Stomach/Bowel: Stomach is nonenlarged. Focal wall thickening of
distal ileum in the right lower quadrant with inflammatory change.
Negative appendix.

Vascular/Lymphatic: No significant vascular findings are present. No
enlarged abdominal or pelvic lymph nodes.

Reproductive: IUD in the uterus.  No adnexal mass

Other: No free air.  Small free fluid in the pelvis

Musculoskeletal: No acute or significant osseous findings.
IMPRESSION: 1. Negative for acute appendicitis.
2. Wall thickening and inflammatory change involving right lower
quadrant distal ileum consistent with ileitis of infectious or
inflammatory etiology. Small free fluid in the pelvis.

## 2021-10-05 ENCOUNTER — Ambulatory Visit: Payer: Self-pay

## 2022-06-02 ENCOUNTER — Ambulatory Visit: Admission: EM | Admit: 2022-06-02 | Discharge: 2022-06-02 | Payer: Self-pay

## 2022-06-02 ENCOUNTER — Emergency Department (HOSPITAL_COMMUNITY)
Admission: EM | Admit: 2022-06-02 | Discharge: 2022-06-02 | Disposition: A | Discharge status: Home / Self Care | Payer: PRIVATE HEALTH INSURANCE | Attending: Emergency Medicine | Admitting: Emergency Medicine

## 2022-06-02 ENCOUNTER — Other Ambulatory Visit: Payer: Self-pay

## 2022-06-02 ENCOUNTER — Emergency Department (HOSPITAL_COMMUNITY): Payer: PRIVATE HEALTH INSURANCE

## 2022-06-02 ENCOUNTER — Encounter (HOSPITAL_COMMUNITY): Payer: Self-pay

## 2022-06-02 DIAGNOSIS — R1033 Periumbilical pain: Secondary | ICD-10-CM

## 2022-06-02 DIAGNOSIS — R1084 Generalized abdominal pain: Secondary | ICD-10-CM | POA: Diagnosis not present

## 2022-06-02 DIAGNOSIS — R109 Unspecified abdominal pain: Secondary | ICD-10-CM | POA: Diagnosis present

## 2022-06-02 LAB — URINALYSIS, ROUTINE W REFLEX MICROSCOPIC
Bilirubin Urine: NEGATIVE
Glucose, UA: NEGATIVE mg/dL
Ketones, ur: NEGATIVE mg/dL
Nitrite: NEGATIVE
Protein, ur: NEGATIVE mg/dL
Specific Gravity, Urine: 1.019 (ref 1.005–1.030)
pH: 6 (ref 5.0–8.0)

## 2022-06-02 LAB — COMPREHENSIVE METABOLIC PANEL WITH GFR
ALT: 33 U/L (ref 0–44)
AST: 25 U/L (ref 15–41)
Albumin: 3.8 g/dL (ref 3.5–5.0)
Alkaline Phosphatase: 46 U/L (ref 38–126)
Anion gap: 9 (ref 5–15)
BUN: 12 mg/dL (ref 6–20)
CO2: 25 mmol/L (ref 22–32)
Calcium: 8.3 mg/dL — ABNORMAL LOW (ref 8.9–10.3)
Chloride: 104 mmol/L (ref 98–111)
Creatinine, Ser: 0.84 mg/dL (ref 0.44–1.00)
GFR, Estimated: >60 mL/min
Glucose, Bld: 92 mg/dL (ref 70–99)
Potassium: 3.8 mmol/L (ref 3.5–5.1)
Sodium: 138 mmol/L (ref 135–145)
Total Bilirubin: 0.8 mg/dL (ref 0.3–1.2)
Total Protein: 7.3 g/dL (ref 6.5–8.1)

## 2022-06-02 LAB — CBC WITH DIFFERENTIAL/PLATELET
Abs Immature Granulocytes: 0.03 K/uL (ref 0.00–0.07)
Basophils Absolute: 0 K/uL (ref 0.0–0.1)
Basophils Relative: 0 %
Eosinophils Absolute: 0.2 K/uL (ref 0.0–0.5)
Eosinophils Relative: 2 %
HCT: 38.2 % (ref 36.0–46.0)
Hemoglobin: 12.4 g/dL (ref 12.0–15.0)
Immature Granulocytes: 0 %
Lymphocytes Relative: 17 %
Lymphs Abs: 1.6 K/uL (ref 0.7–4.0)
MCH: 25.9 pg — ABNORMAL LOW (ref 26.0–34.0)
MCHC: 32.5 g/dL (ref 30.0–36.0)
MCV: 79.7 fL — ABNORMAL LOW (ref 80.0–100.0)
Monocytes Absolute: 1.1 K/uL — ABNORMAL HIGH (ref 0.1–1.0)
Monocytes Relative: 12 %
Neutro Abs: 6.6 K/uL (ref 1.7–7.7)
Neutrophils Relative %: 69 %
Platelets: 261 K/uL (ref 150–400)
RBC: 4.79 MIL/uL (ref 3.87–5.11)
RDW: 13.8 % (ref 11.5–15.5)
WBC: 9.6 K/uL (ref 4.0–10.5)
nRBC: 0 % (ref 0.0–0.2)

## 2022-06-02 LAB — LIPASE, BLOOD: Lipase: 32 U/L (ref 11–51)

## 2022-06-02 LAB — PREGNANCY, URINE: Preg Test, Ur: NEGATIVE

## 2022-06-02 MED ORDER — ONDANSETRON 4 MG PO TBDP: 4.0000 mg | ORAL_TABLET | Freq: Three times a day (TID) | ORAL | 0 refills | Status: AC | PRN

## 2022-06-02 MED ORDER — ONDANSETRON HCL 4 MG/2ML IJ SOLN
4.0000 mg | Freq: Once | INTRAMUSCULAR | Status: AC
  Administered 2022-06-02: 4 mg via INTRAVENOUS
  Filled 2022-06-02: qty 2

## 2022-06-02 MED ORDER — IOHEXOL 300 MG/ML  SOLN
100.0000 mL | Freq: Once | INTRAMUSCULAR | Status: AC | PRN
  Administered 2022-06-02: 100 mL via INTRAVENOUS

## 2022-06-02 MED ORDER — DICYCLOMINE HCL 10 MG PO CAPS
10.0000 mg | ORAL_CAPSULE | Freq: Once | ORAL | Status: AC
  Administered 2022-06-02: 10 mg via ORAL
  Filled 2022-06-02: qty 1

## 2022-06-02 MED ORDER — DICYCLOMINE HCL 20 MG PO TABS: 20.0000 mg | ORAL_TABLET | Freq: Two times a day (BID) | ORAL | 0 refills | Status: DC

## 2022-06-02 MED ORDER — SODIUM CHLORIDE 0.9 % IV BOLUS
1000.0000 mL | Freq: Once | INTRAVENOUS | Status: AC
  Administered 2022-06-02: 1000 mL via INTRAVENOUS

## 2022-06-02 MED ORDER — ONDANSETRON 4 MG PO TBDP: 4.0000 mg | ORAL_TABLET | Freq: Three times a day (TID) | ORAL | 0 refills | Status: DC | PRN

## 2022-06-02 MED ORDER — DICYCLOMINE HCL 20 MG PO TABS: 20.0000 mg | ORAL_TABLET | Freq: Two times a day (BID) | ORAL | 0 refills | Status: AC

## 2022-06-02 MED ORDER — KETOROLAC TROMETHAMINE 30 MG/ML IJ SOLN
30.0000 mg | Freq: Once | INTRAMUSCULAR | Status: AC
  Administered 2022-06-02: 30 mg via INTRAVENOUS
  Filled 2022-06-02: qty 1

## 2022-06-02 NOTE — ED Triage Notes (Signed)
Pt presents with abdominal pain around her belly button X 4 days with some nausea.

## 2022-06-02 NOTE — ED Triage Notes (Signed)
Patient sent from urgent care for an ultrasound related to her abdominal pain near her belly button. Vomited x1. No diarrhea. No burning with urination.

## 2022-06-02 NOTE — ED Provider Notes (Signed)
[facility] Provider Note   CSN: [id number] Arrival date & time: 06/02/22  1418     History  Chief Complaint  Patient presents with   Abdominal Pain    Megan Benton is a 37 y.o. female.  Pt is a 37 yo female with pmhx significant for Hep B.  Pt has been having abd pain with n/v for the past few days. Pt does not have fever.  She initially went to UC and was sent here for further eval.    Due to language barrier, an interpreter was present during the history-taking and subsequent discussion (and for part of the physical exam) with this patient.        Home Medications Prior to Admission medications   Medication Sig Start Date End Date Taking? Authorizing Provider  dicyclomine (BENTYL) 20 MG tablet Take 1 tablet (20 mg total) by mouth 2 (two) times daily. 06/02/22  Yes Isla Pence, MD  ondansetron (ZOFRAN-ODT) 4 MG disintegrating tablet Take 1 tablet (4 mg total) by mouth every 8 (eight) hours as needed. 06/02/22  Yes Isla Pence, MD  lidocaine (XYLOCAINE) 2 % solution Use as directed 10 mLs in the mouth or throat every 4 (four) hours as needed (throat pain). Swish and spit out- do not swallow 01/11/20   Katy Apo, NP  naproxen (NAPROSYN) 375 MG tablet Take 1 tablet (375 mg total) by mouth 2 (two) times daily as needed for moderate pain. 01/11/20   Katy Apo, NP      Allergies    Patient has no known allergies.    Review of Systems   Review of Systems  Gastrointestinal:  Positive for abdominal pain.  All other systems reviewed and are negative.   Physical Exam Updated Vital Signs BP 124/81 (BP Location: Left Arm)   Pulse 77   Temp 98.6 F (37 C) (Oral)   Resp 17   SpO2 100%  Physical Exam Vitals and nursing note reviewed.  Constitutional:      Appearance: She is well-developed.  HENT:     Head: Normocephalic and atraumatic.     Mouth/Throat:     Mouth: Mucous membranes are moist.     Pharynx:  Oropharynx is clear.  Eyes:     Extraocular Movements: Extraocular movements intact.     Pupils: Pupils are equal, round, and reactive to light.  Cardiovascular:     Rate and Rhythm: Normal rate and regular rhythm.     Heart sounds: Normal heart sounds.  Abdominal:     General: Abdomen is flat. Bowel sounds are normal.     Palpations: Abdomen is soft.     Tenderness: There is abdominal tenderness in the periumbilical area and suprapubic area.  Skin:    General: Skin is warm.     Capillary Refill: Capillary refill takes less than 2 seconds.  Neurological:     General: No focal deficit present.     Mental Status: She is alert and oriented to person, place, and time.  Psychiatric:        Mood and Affect: Mood normal.        Behavior: Behavior normal.     ED Results / Procedures / Treatments   Labs (all labs ordered are listed, but only abnormal results are displayed) Labs Reviewed  CBC WITH DIFFERENTIAL/PLATELET - Abnormal; Notable for the following components:      Result Value   MCV 79.7 (*)    MCH 25.9 (*)  Monocytes Absolute 1.1 (*)    All other components within normal limits  COMPREHENSIVE METABOLIC PANEL - Abnormal; Notable for the following components:   Calcium 8.3 (*)    All other components within normal limits  URINALYSIS, ROUTINE W REFLEX MICROSCOPIC - Abnormal; Notable for the following components:   APPearance HAZY (*)    Hgb urine dipstick SMALL (*)    Leukocytes,Ua TRACE (*)    Bacteria, UA RARE (*)    All other components within normal limits  LIPASE, BLOOD  PREGNANCY, URINE    EKG None  Radiology CT ABDOMEN PELVIS W CONTRAST  Result Date: 06/02/2022 CLINICAL DATA:  Right lower quadrant abdominal pain EXAM: CT ABDOMEN AND PELVIS WITH CONTRAST TECHNIQUE: Multidetector CT imaging of the abdomen and pelvis was performed using the standard protocol following bolus administration of intravenous contrast. RADIATION DOSE REDUCTION: This exam was performed  according to the departmental dose-optimization program which includes automated exposure control, adjustment of the mA and/or kV according to patient size and/or use of iterative reconstruction technique. CONTRAST:  164mL OMNIPAQUE IOHEXOL 300 MG/ML  SOLN COMPARISON:  05/22/2019 FINDINGS: Lower chest: No acute pleural or parenchymal lung disease. Hepatobiliary: No focal liver abnormality is seen. No gallstones, gallbladder wall thickening, or biliary dilatation. Pancreas: Unremarkable. No pancreatic ductal dilatation or surrounding inflammatory changes. Spleen: Normal in size without focal abnormality. Adrenals/Urinary Tract: Adrenal glands are unremarkable. Kidneys are normal, without renal calculi, focal lesion, or hydronephrosis. Bladder is unremarkable. Stomach/Bowel: No bowel obstruction or ileus. Normal appendix right lower quadrant. There is mild segmental jejunal wall thickening within the left lower quadrant which may reflect inflammatory or infectious enteritis. Vascular/Lymphatic: No significant vascular findings are present. No enlarged abdominal or pelvic lymph nodes. Reproductive: IUD within the endometrial cavity. Uterus and adnexal structures are age-appropriate. Other: Trace pelvic free fluid may be physiologic or reactive. No free intraperitoneal gas. No abdominal wall hernia. Musculoskeletal: No acute or destructive bony lesions. Reconstructed images demonstrate no additional findings. IMPRESSION: 1. Segmental jejunal wall thickening within the left lower quadrant consistent with inflammatory or infectious enteritis. 2. Normal appendix. 3. Trace pelvic free fluid, which may be reactive or physiologic. Electronically Signed   By: Randa Ngo M.D.   On: 06/02/2022 17:37    Procedures Procedures    Medications Ordered in ED Medications  ketorolac (TORADOL) 30 MG/ML injection 30 mg (has no administration in time range)  dicyclomine (BENTYL) capsule 10 mg (has no administration in time  range)  sodium chloride 0.9 % bolus 1,000 mL (1,000 mLs Intravenous New Bag/Given 06/02/22 1454)  ondansetron (ZOFRAN) injection 4 mg (4 mg Intravenous Given 06/02/22 1455)  iohexol (OMNIPAQUE) 300 MG/ML solution 100 mL (100 mLs Intravenous Contrast Given 06/02/22 1717)    ED Course/ Medical Decision Making/ A&P                             Medical Decision Making Amount and/or Complexity of Data Reviewed Labs: ordered. Radiology: ordered.  Risk Prescription drug management.   This patient presents to the ED for concern of abd pain, this involves an extensive number of treatment options, and is a complaint that carries with it a high risk of complications and morbidity.  The differential diagnosis includes pregnancy, appendicitis, uti, other infection   Co morbidities that complicate the patient evaluation  Hx hep b   Additional history obtained:  Additional history obtained from epic chart review   Lab Tests:  I Ordered,  and personally interpreted labs.  The pertinent results include:  cbc nl, cmp nl, lip nl, preg neg, ua with sm hgb   Imaging Studies ordered:  I ordered imaging studies including ct abd/pelvis  I independently visualized and interpreted imaging which showed   1. Segmental jejunal wall thickening within the left lower quadrant consistent with inflammatory or infectious enteritis. 2. Normal appendix. 3. Trace pelvic free fluid, which may be reactive or physiologic I agree with the radiologist interpretation   Cardiac Monitoring:  The patient was maintained on a cardiac monitor.  I personally viewed and interpreted the cardiac monitored which showed an underlying rhythm of: nsr   Medicines ordered and prescription drug management:  I ordered medication including ivfs, zofran, and toradol  for dehydration and pain and nausea  Reevaluation of the patient after these medicines showed that the patient improved I have reviewed the patients home medicines and  have made adjustments as needed   Test Considered:  ct   Critical Interventions:  Pain control  Problem List / ED Course:  Abd pain:  no etiology. Pain is not in her lower quadrants, so I don't think it's ovarian.  Pt is stable for d/c.  Return if worse.  F/u with pcp.   Reevaluation:  After the interventions noted above, I reevaluated the patient and found that they have :improved   Social Determinants of Health:  No insurance, no pcp, speaks [place]   Dispostion:  After consideration of the diagnostic results and the patients response to treatment, I feel that the patent would benefit from discharge with outpatient f/u.          Final Clinical Impression(s) / ED Diagnoses Final diagnoses:  Generalized abdominal pain    Rx / DC Orders ED Discharge Orders          Ordered    dicyclomine (BENTYL) 20 MG tablet  2 times daily        06/02/22 1748    ondansetron (ZOFRAN-ODT) 4 MG disintegrating tablet  Every 8 hours PRN        06/02/22 1748              Isla Pence, MD 06/02/22 1751

## 2022-06-02 NOTE — ED Notes (Signed)
Patient is being discharged from the Urgent Care and sent to the Emergency Department via personal vehicle . Per Provider Ewell Poe, patient is in need of higher level of care due to abdominal pain. Patient is aware and verbalizes understanding of plan of care.    Vitals:   06/02/22 1228 06/02/22 1229  BP:  108/71  Pulse:  76  Resp: 18   Temp:  97.8 F (36.6 C)  SpO2:  97%

## 2022-06-02 NOTE — ED Provider Notes (Signed)
Patient here today for evaluation of central abdominal pain with associated nausea. Recommended further evaluation in the ED as we are unable to perform labs and her history of hepatitis, etc. Patient is agreeable and husband will transport via [place].    Francene Finders, PA-C 06/02/22 1246
